# Patient Record
Sex: Female | Born: 1975 | Hispanic: No | Marital: Married | Smoking: Former smoker
Health system: Southern US, Community
[De-identification: ages and names within clinical notes are randomized; demographics above are authoritative.]

## PROBLEM LIST (undated history)

## (undated) ENCOUNTER — Emergency Department (HOSPITAL_COMMUNITY): Admission: EM | Disposition: A | Discharge status: Home / Self Care | Payer: Self-pay

## (undated) HISTORY — PX: BACK SURGERY: SHX140

## (undated) HISTORY — PX: CHOLECYSTECTOMY: SHX55

## (undated) HISTORY — PX: ABDOMINAL HYSTERECTOMY: SHX81

## (undated) HISTORY — PX: TUBAL LIGATION: SHX77

---

## 2000-12-12 ENCOUNTER — Emergency Department (HOSPITAL_COMMUNITY): Admission: EM | Admit: 2000-12-12 | Discharge: 2000-12-13 | Payer: Self-pay | Admitting: *Deleted

## 2000-12-13 ENCOUNTER — Encounter: Payer: Self-pay | Admitting: Emergency Medicine

## 2001-03-22 ENCOUNTER — Encounter: Admission: RE | Admit: 2001-03-22 | Discharge: 2001-03-22 | Payer: Self-pay | Admitting: Family Medicine

## 2001-03-22 ENCOUNTER — Encounter: Payer: Self-pay | Admitting: Family Medicine

## 2001-11-01 ENCOUNTER — Other Ambulatory Visit: Admission: RE | Admit: 2001-11-01 | Discharge: 2001-11-01 | Payer: Self-pay | Admitting: Family Medicine

## 2001-12-23 ENCOUNTER — Encounter: Payer: Self-pay | Admitting: Family Medicine

## 2001-12-23 ENCOUNTER — Encounter: Admission: RE | Admit: 2001-12-23 | Discharge: 2001-12-23 | Payer: Self-pay | Admitting: Family Medicine

## 2002-08-18 ENCOUNTER — Encounter: Admission: RE | Admit: 2002-08-18 | Discharge: 2002-08-18 | Payer: Self-pay | Admitting: *Deleted

## 2002-09-18 ENCOUNTER — Ambulatory Visit (HOSPITAL_COMMUNITY): Admission: RE | Admit: 2002-09-18 | Discharge: 2002-09-18 | Payer: Self-pay | Admitting: *Deleted

## 2002-09-29 ENCOUNTER — Encounter: Admission: RE | Admit: 2002-09-29 | Discharge: 2002-09-29 | Payer: Self-pay | Admitting: *Deleted

## 2003-01-29 ENCOUNTER — Encounter: Payer: Self-pay | Admitting: Family Medicine

## 2003-01-29 ENCOUNTER — Ambulatory Visit (HOSPITAL_COMMUNITY): Admission: RE | Admit: 2003-01-29 | Discharge: 2003-01-29 | Payer: Self-pay | Admitting: Family Medicine

## 2006-06-30 ENCOUNTER — Other Ambulatory Visit: Admission: RE | Admit: 2006-06-30 | Discharge: 2006-06-30 | Payer: Self-pay | Admitting: Family Medicine

## 2009-02-22 ENCOUNTER — Encounter: Admission: RE | Admit: 2009-02-22 | Discharge: 2009-04-18 | Payer: Self-pay | Admitting: Specialist

## 2010-08-01 ENCOUNTER — Inpatient Hospital Stay (HOSPITAL_COMMUNITY)
Admission: EM | Admit: 2010-08-01 | Discharge: 2010-08-03 | Payer: Self-pay | Source: Home / Self Care | Attending: Internal Medicine | Admitting: Internal Medicine

## 2010-08-04 LAB — COMPREHENSIVE METABOLIC PANEL
ALT: 16 U/L (ref 0–35)
AST: 28 U/L (ref 0–37)
Albumin: 3.8 g/dL (ref 3.5–5.2)
Alkaline Phosphatase: 56 U/L (ref 39–117)
BUN: 9 mg/dL (ref 6–23)
CO2: 25 mEq/L (ref 19–32)
Calcium: 9 mg/dL (ref 8.4–10.5)
Chloride: 106 mEq/L (ref 96–112)
Creatinine, Ser: 0.9 mg/dL (ref 0.4–1.2)
GFR calc Af Amer: >60 mL/min (ref >60)
GFR calc non Af Amer: >60 mL/min (ref >60)
Glucose, Bld: 81 mg/dL (ref 70–99)
Potassium: 3.8 mEq/L (ref 3.5–5.1)
Sodium: 138 mEq/L (ref 135–145)
Total Bilirubin: 0.4 mg/dL (ref 0.3–1.2)
Total Protein: 7.2 g/dL (ref 6.0–8.3)

## 2010-08-04 LAB — DIFFERENTIAL
Basophils Absolute: 0 10*3/uL (ref 0.0–0.1)
Basophils Absolute: 0 10*3/uL (ref 0.0–0.1)
Basophils Relative: 0 % (ref 0–1)
Basophils Relative: 0 % (ref 0–1)
Eosinophils Absolute: 3.7 10*3/uL — ABNORMAL HIGH (ref 0.0–0.7)
Eosinophils Absolute: 3.1 10*3/uL — ABNORMAL HIGH (ref 0.0–0.7)
Eosinophils Relative: 27 % — ABNORMAL HIGH (ref 0–5)
Eosinophils Relative: 28 % — ABNORMAL HIGH (ref 0–5)
Lymphocytes Relative: 12 % (ref 12–46)
Lymphocytes Relative: 10 % — ABNORMAL LOW (ref 12–46)
Lymphs Abs: 1.7 10*3/uL (ref 0.7–4.0)
Lymphs Abs: 1.1 10*3/uL (ref 0.7–4.0)
Monocytes Absolute: 1 10*3/uL (ref 0.1–1.0)
Monocytes Absolute: 0.7 10*3/uL (ref 0.1–1.0)
Monocytes Relative: 7 % (ref 3–12)
Monocytes Relative: 6 % (ref 3–12)
Neutro Abs: 7.4 10*3/uL (ref 1.7–7.7)
Neutro Abs: 6 10*3/uL (ref 1.7–7.7)
Neutrophils Relative %: 54 % (ref 43–77)
Neutrophils Relative %: 56 % (ref 43–77)

## 2010-08-04 LAB — TSH: TSH: 1.819 u[IU]/mL (ref 0.350–4.500)

## 2010-08-04 LAB — LIPASE, BLOOD
Lipase: 578 U/L — ABNORMAL HIGH (ref 11–59)
Lipase: 31 U/L (ref 11–59)

## 2010-08-04 LAB — POCT CARDIAC MARKERS
CKMB, poc: <1 ng/mL — ABNORMAL LOW (ref 1.0–8.0)
CKMB, poc: <1 ng/mL — ABNORMAL LOW (ref 1.0–8.0)
Myoglobin, poc: 80.2 ng/mL (ref 12–200)
Myoglobin, poc: 57.6 ng/mL (ref 12–200)
Troponin i, poc: <0.05 ng/mL (ref 0.00–0.09)
Troponin i, poc: <0.05 ng/mL (ref 0.00–0.09)

## 2010-08-04 LAB — GLUCOSE, CAPILLARY: Glucose-Capillary: 95 mg/dL (ref 70–99)

## 2010-08-04 LAB — LIPID PANEL
Cholesterol: 115 mg/dL (ref 0–200)
HDL: 37 mg/dL — ABNORMAL LOW (ref >39)
LDL Cholesterol: 73 mg/dL (ref 0–99)
Total CHOL/HDL Ratio: 3.1 RATIO
Triglycerides: 26 mg/dL (ref <150)
VLDL: 5 mg/dL (ref 0–40)

## 2010-08-04 LAB — URINALYSIS, ROUTINE W REFLEX MICROSCOPIC
Bilirubin Urine: NEGATIVE
Hgb urine dipstick: NEGATIVE
Ketones, ur: NEGATIVE mg/dL
Nitrite: NEGATIVE
Protein, ur: NEGATIVE mg/dL
Specific Gravity, Urine: 1.011 (ref 1.005–1.030)
Urine Glucose, Fasting: NEGATIVE mg/dL
Urobilinogen, UA: 1 mg/dL (ref 0.0–1.0)
pH: 7.5 (ref 5.0–8.0)

## 2010-08-04 LAB — CBC
HCT: 36.2 % (ref 36.0–46.0)
HCT: 33.6 % — ABNORMAL LOW (ref 36.0–46.0)
Hemoglobin: 11.7 g/dL — ABNORMAL LOW (ref 12.0–15.0)
Hemoglobin: 10.7 g/dL — ABNORMAL LOW (ref 12.0–15.0)
MCH: 27.7 pg (ref 26.0–34.0)
MCH: 27.8 pg (ref 26.0–34.0)
MCHC: 32.3 g/dL (ref 30.0–36.0)
MCHC: 31.8 g/dL (ref 30.0–36.0)
MCV: 85.8 fL (ref 78.0–100.0)
MCV: 87.3 fL (ref 78.0–100.0)
Platelets: 316 10*3/uL (ref 150–400)
Platelets: 273 10*3/uL (ref 150–400)
RBC: 4.22 MIL/uL (ref 3.87–5.11)
RBC: 3.85 MIL/uL — ABNORMAL LOW (ref 3.87–5.11)
RDW: 12.7 % (ref 11.5–15.5)
RDW: 12.7 % (ref 11.5–15.5)
WBC: 13.8 10*3/uL — ABNORMAL HIGH (ref 4.0–10.5)
WBC: 10.9 10*3/uL — ABNORMAL HIGH (ref 4.0–10.5)

## 2010-08-04 LAB — BASIC METABOLIC PANEL
BUN: 8 mg/dL (ref 6–23)
CO2: 23 mEq/L (ref 19–32)
Calcium: 8 mg/dL — ABNORMAL LOW (ref 8.4–10.5)
Chloride: 111 mEq/L (ref 96–112)
Creatinine, Ser: 0.83 mg/dL (ref 0.4–1.2)
GFR calc Af Amer: >60 mL/min (ref >60)
GFR calc non Af Amer: >60 mL/min (ref >60)
Glucose, Bld: 68 mg/dL — ABNORMAL LOW (ref 70–99)
Potassium: 3.9 mEq/L (ref 3.5–5.1)
Sodium: 139 mEq/L (ref 135–145)

## 2010-08-15 NOTE — Discharge Summary (Signed)
NAMEROCKELLE, HEUERMAN              ACCOUNT NO.:  [id number]  MEDICAL RECORD NO.:  [id number]          Hendricks TYPE:  INP  LOCATION:  5502                         FACILITY:  [facility]  PHYSICIAN:  Tilford Pillar, MD     DATE OF BIRTH:  1976-01-16  DATE OF ADMISSION:  08/01/2010 DATE OF DISCHARGE:  08/03/2010                              DISCHARGE SUMMARY   DISCHARGE DIAGNOSES: 1. Acute pancreatitis. 2. Anemia. 3. Headache.  DISCHARGE MEDICATIONS:  Cymbalta and Aleve.  DISPOSITION AND FOLLOWUP:  Jasmine Hendricks was discharged from [facility] in stable and improved condition.  Jasmine Hendricks will need to follow up with Jasmine Hendricks primary care physician, Dr. Denny Peon in 1-2 weeks for hospital followup.  CONSULTATIONS:  None performed.  PROCEDURES PERFORMED: 1. Chest x-ray with 2 views.  This indicated no active cardiopulmonary     disease. 2. Abdominal ultrasound.  This indicated a surgically absent     gallbladder.  Distal common bile duct obscured by duodenal gas     bubble.  Gallbladder surgically absent and no other significant     abnormalities found.  ADMISSION HISTORY OF PRESENT ILLNESS:  Jasmine Hendricks is a 35 year old female who presents with Jasmine chief complaint of persistent abdominal pain for Jasmine last 6 hours.  Jasmine Hendricks describes Jasmine pain as burning in quality and radiating to Jasmine left shoulder and also through to Jasmine back, some diaphoresis present with Jasmine pain.  Jasmine Hendricks denies any cough, dyspnea, fever, or palpitations.  Over Jasmine past 8 months, Jasmine Hendricks describes having alternating constipation and diarrhea.  Jasmine Hendricks denies blood in Jasmine stools, black tarry stools, pale stools, or particularly foul-smelling stools.  Additionally, Jasmine Hendricks complains of some nausea and vomiting over Jasmine same period of time associated with eating.  Jasmine Hendricks denies bloody or bilious vomit.  Finally, Jasmine Hendricks describes a long history of hypoglycemic episodes with glucose readings in Jasmine 30-40 range, active smoker with 0.5  to 1.0 packs per day.  Alcohol intake includes 1-2 drinks per week at most, denied illicit drug use, uses daily Aleve x2, and occasionally use additional ibuprofen.  Denies having experienced any painful episodes like this in Jasmine past.  ADMISSION PHYSICAL EXAMINATION:  VITAL SIGNS:  Temperature 98.7 degrees Fahrenheit, pulse 75 beats per minute, blood pressure 125/84, respiratory rate 16, O2 saturation 100% on room air. GENERAL:  Jasmine Hendricks is alert and oriented x3, euphoric on Dilaudid. EYES:  PERRL, EOMI. EARS, NOSE, THROAT:  Mucous membranes moist. NECK:  Supple. RESPIRATORY:  Lungs clear to auscultation bilaterally.  Normal work of breathing. CARDIOVASCULAR:  Regular rate and rhythm.  No murmurs, rubs, or gallops. Distal pulses 2 positive. ABDOMEN:  Soft, nontender, nondistended.  No rebound tenderness.  Bowel sounds positive.  No organomegaly. EXTREMITIES:  No pedal edema, cyanosis, or clubbing. NEURO:  Motor function and sensation to fine touch grossly intact. Cranial nerves II through XII grossly intact. PSYCH:  Goal directed, euphoric on Dilaudid.  ADMISSION LABORATORY DATA:  White blood cells 13.8, red blood cells 4.22, hemoglobin 11.7, hematocrit 36.2, MCV 85.8, MCH 27.7, MCHC 32.3, RDW 12.7, platelet count 316, neutrophils percentage 54, lymphocytes percentage 12, monocytes  percentage 7, eosinophils percentage 27, basophils percentage 0.  Neutrophils absolute 7.4, lymphocytes absolute 1.7, monocytes absolute 1.0, eosinophils absolute 3.7, basophils absolute 0.0.  Smear review morphology unremarkable.  Sodium 138, potassium 3.8, chloride 106, CO2 of 25, glucose 81, BUN 9, creatinine 0.90.  GFR greater than 60.  Bilirubin total 0.4, alkaline phosphatase 56, AST 28, ALT 16, total protein 7.2, albumin blood 3.8, calcium 9.0. Lipase 578.  Urinalysis, urine color yellow, appearance clear.  Specific gravity 1.011, pH 7.5, urine glucose negative, bilirubin negative, ketones  negative, blood negative, protein negative, urobilinogen 1.0, nitrites negative, leukocytes negative.  CK-MB point-of-care less than 1.0.  Troponin I point-of-care less than 0.05.  Myoglobin point-of-care 57.6.  Cholesterol 115, triglycerides - LIPR 26, cholesterol, HDL 37, cholesterol, LDL 73, cholesterol, VLDL 5, total cholesterol to HDL ratio 3.1.  Thyroid stimulating hormone 1.819.  HOSPITAL COURSE BY PROBLEM LIST: 1. Pancreatitis.  Jasmine Hendricks presented on August 01, 2010, with Jasmine     approximately 6 hours of persistent epigastric abdominal pain     radiate through to Jasmine back and to Jasmine left shoulder.  Admission     labs indicated leukocytosis with Jasmine white blood cell count of 13.3     and also elevated lipase of 578.  Abdominal ultrasound indicated no     stones were present.  I started Jasmine Hendricks on IV Dilaudid and     normal saline and Jasmine Hendricks was placed on n.p.o. status.  Jasmine     Hendricks received Jasmine Hendricks last dose of Dilaudid in approximately 22:30     p.m. on Jasmine first night of admission.  In Jasmine morning, Jasmine Hendricks     described no abdominal pain while resting or to palpation, but Jasmine Hendricks     did complain of diffuse frontal headache.  Labs in Jasmine morning     showed Jasmine lipase at decreased to 31.  Diet was advanced to clear     liquids and advanced as tolerated with meals.  Jasmine Hendricks was given     Tylenol p.r.n. for pain.  Through Jasmine course of Jasmine day, Jasmine     Hendricks continued to advance Jasmine Hendricks diet without complaint and     remained stable and without any complaints until Jasmine Hendricks discharge on     August 03, 2010.  Likely cause of Jasmine Hendricks complaints is unknown but     could be secondary to a small common bile duct stone that passed      spontaneously. 2. Anemia.  Admission labs indicated a normocytic anemia with Jasmine     hemoglobin of 11.7 and an MCV of 85.7.  Considering Jasmine Hendricks history of     possible IBS and acute pancreatitis, this is likely an anemia with     chronic inflammation.  Jasmine Hendricks vitals and  hemoglobin remain stable due     to course of Jasmine Hendricks hospitalization. 3. Headache.  Jasmine Hendricks complained of headache on Jasmine morning after     Jasmine Hendricks first night of admission and likely from having been n.p.o. for     nearly 24 hours.  Jasmine Hendricks was given Tylenol p.r.n. for pain and     Jasmine Hendricks diet was advanced.  Headache resolved with Tylenol.  DISCHARGE VITAL SIGNS:  Temperature 98.0, pulse 94, blood pressure 121/80, respiratory rate 17, O2 saturation 98% on room air.  DISCHARGE LABORATORY DATA:  White blood cells 10.9, red blood cells 3.85, hemoglobin 10.7, hematocrit 33.6, MCV 87.3, MCH 27.8, MCHC 31.8, RDW 12.7, platelet count  273, neutrophils percentage 56, lymphocytes percentage 10, monocytes percentage 6, eosinophils percentage 28, basophils percentage 0.  Neutrophils absolute 6.0, lymphocytes absolute 1.1, monocytes absolute 0.7, eosinophils absolute 3.1, basophils absolute 0.0.  Red blood cell morphology polychromasia present.  Sodium 139, potassium 3.9, chloride 111, CO2 of 23, glucose 68, BUN 8, creatinine 0.83.  GFR greater than 60.  Calcium 8.0.  Lipase 31.    ______________________________ Leodis Sias, MD   ______________________________ Tilford Pillar, MD    CP/MEDQ  D:  08/05/2010  T:  08/06/2010  Job:  [id number]  Electronically Signed by Leodis Sias MD on 08/07/2010 01:47:01 PM Electronically Signed by Blanch Media M.D. on 08/13/2010 10:05:19 AM

## 2010-09-16 ENCOUNTER — Other Ambulatory Visit (INDEPENDENT_AMBULATORY_CARE_PROVIDER_SITE_OTHER): Payer: Self-pay | Admitting: Internal Medicine

## 2010-09-16 ENCOUNTER — Ambulatory Visit (INDEPENDENT_AMBULATORY_CARE_PROVIDER_SITE_OTHER): Payer: PRIVATE HEALTH INSURANCE | Admitting: Internal Medicine

## 2010-09-16 DIAGNOSIS — K859 Acute pancreatitis without necrosis or infection, unspecified: Secondary | ICD-10-CM

## 2010-09-16 DIAGNOSIS — R1013 Epigastric pain: Secondary | ICD-10-CM

## 2010-09-18 ENCOUNTER — Ambulatory Visit (HOSPITAL_COMMUNITY)
Admission: RE | Admit: 2010-09-18 | Discharge: 2010-09-18 | Disposition: A | Discharge status: Home / Self Care | Payer: PRIVATE HEALTH INSURANCE | Source: Ambulatory Visit | Attending: Internal Medicine | Admitting: Internal Medicine

## 2010-09-18 ENCOUNTER — Encounter (HOSPITAL_COMMUNITY): Payer: Self-pay

## 2010-09-18 DIAGNOSIS — K859 Acute pancreatitis without necrosis or infection, unspecified: Secondary | ICD-10-CM

## 2010-09-18 DIAGNOSIS — R1013 Epigastric pain: Secondary | ICD-10-CM

## 2010-09-18 DIAGNOSIS — K429 Umbilical hernia without obstruction or gangrene: Secondary | ICD-10-CM | POA: Insufficient documentation

## 2010-09-18 MED ORDER — IOHEXOL 300 MG/ML  SOLN
100.0000 mL | Freq: Once | INTRAMUSCULAR | Status: AC | PRN
  Administered 2010-09-18: 100 mL via INTRAVENOUS

## 2010-09-21 NOTE — Consult Note (Addendum)
Jasmine Hendricks              ACCOUNT NO.:  [id number]  MEDICAL RECORD NO.:  [id number]           PATIENT TYPE:  LOCATION:   Office                             FACILITY:  PHYSICIAN:  Jasmine Hendricks, M.D.    DATE OF BIRTH:  01/27/1976  DATE OF CONSULTATION: 09/16/2010 DATE OF DISCHARGE:                                CONSULTATION   Ms.  Jasmine Hendricks is a referral from Dr. Dimas Hendricks for pancreatitis. Jasmine Hendricks was apparently admitted to [facility] in August 01, 2010, with a diagnosis of pancreatitis.  She spent two nights in the hospital.  Her symptoms included nausea, vomiting and epigastric pain.  She has had the nausea and vomiting for approximately several years.  She denies excessive EtOH.  She states today she does have constant epigastric pain.  It was noted on admission when she was at the [facility], there her amylase was elevated at 578.  On August 06, 2010, her amylase had came down to 37 and this apparently was on an outpatient basis ordered by Dr. Garner Hendricks.  She did undergo ultrasound of the abdomen on August 01, 2010, which revealed a normal abdominal ultrasound, postcholecystectomy.  Her common bile duct measured approximately 7 mm in diameter.  The distal duct obscured by duodenal bowel gas.  No visible bile duct stones.  The pancreas was normal.  She usually has a bowel movement every other day and has been no weight loss.  Labs during her admission; her WBC count was 13.8, hemoglobin was 11.7, hematocrit was 36.2, her MCV was 85.8, her platelet count was 316. Sodium 138, potassium 3.8, chloride 106, glucose 81, BUN 9, her total bilirubin was 0.4, ALP 96, AST 26, total protein 7, albumin 3.6.  Her urine negative.  On August 07, 2010, her hemoglobin was 10.8, hematocrit was 33.6.  Her serum iron was 13, saturation 4, ferritin 8, vitamin B12 130, folate 11.6.  Her iron binding capacity was 367 and a UIBC was 354.  She was started on iron 325  daily.  HOME MEDICATIONS: 1. Cymbalta 60 mg daily. 2. Advil 800 mg as needed, but not often. 3. Iron 325 two a day. 4. Zofran p.r.n.  She is allergic to Morphine.  She has had a partial hysterectomy.  She underwent a cholecystectomy in 2009 for gallstones, history of hypertension.  FAMILY HISTORY:  Her mother is deceased from an aneurysm.  Her father is alive, health unknown.  One sister in good health.  Two brothers in good health.  SOCIAL HISTORY:  She is married.  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  She smokes one pack every 2 days.  She rarely drinks alcohol and she has three children in good health.  OBJECTIVE:  VITAL SIGNS:  Her weight is 183, her height is 5 feet and 4 inches, her temperature is 98, her blood pressure is 102/72 and her pulse is 76. HEENT:  She has natural teeth.  Her oral mucosa is moist.  Her conjunctivae is pink.  Her sclerae are anicteric.  Her thyroid is normal. NECK:  There is no cervical lymphadenopathy. LUNGS:  Clear. ABDOMEN:  Soft.  Bowel sounds are positive.  No masses.  She does have epigastric tenderness.  I guaiaced her stool given her history of anemia and she was guaiac negative today.  ASSESSMENT:  Ms. Jasmine Hendricks is a 35 year old female with complaints of epigastric pain, nausea and vomiting.  Her nausea and vomiting has been occurring on and off for couple of years now.  She does continue to have epigastric pain.  RECOMMENDATIONS:  I did discuss this case with Jasmine Hendricks.  We will get a CT of the abdomen and pelvis to look at her pancreas again.  If her CT is normal, he will proceed with an EGD.  If her EGD is normal, he will proceed with an EUS and further recommendations once we have these tests.    ______________________________ Jasmine Ar, NP   ______________________________ Jasmine Hendricks, M.D.    TS/MEDQ  D:  09/16/2010  T:  09/17/2010  Job:  [id number]  cc:   Dr. Dimas Hendricks  Electronically Signed by Jasmine Ar PA on 10/10/2010 11:46:26 AM Electronically Signed by Jasmine Hendricks M.D. on 10/21/2010 12:55:41 PM

## 2010-10-08 ENCOUNTER — Encounter (HOSPITAL_BASED_OUTPATIENT_CLINIC_OR_DEPARTMENT_OTHER): Payer: PRIVATE HEALTH INSURANCE | Admitting: Internal Medicine

## 2010-10-08 ENCOUNTER — Ambulatory Visit (HOSPITAL_COMMUNITY)
Admission: RE | Admit: 2010-10-08 | Discharge: 2010-10-08 | Disposition: A | Discharge status: Home / Self Care | Payer: PRIVATE HEALTH INSURANCE | Source: Ambulatory Visit | Attending: Internal Medicine | Admitting: Internal Medicine

## 2010-10-08 DIAGNOSIS — R112 Nausea with vomiting, unspecified: Secondary | ICD-10-CM | POA: Insufficient documentation

## 2010-10-08 DIAGNOSIS — R1013 Epigastric pain: Secondary | ICD-10-CM | POA: Insufficient documentation

## 2010-10-08 DIAGNOSIS — K2960 Other gastritis without bleeding: Secondary | ICD-10-CM

## 2010-10-09 LAB — H. PYLORI ANTIBODY, IGG: H Pylori IgG: 0.71 {ISR}

## 2010-10-21 NOTE — Op Note (Signed)
  NAMEJAILINE, Jasmine Hendricks              ACCOUNT NO.:  [id number]  MEDICAL RECORD NO.:  [id number]           PATIENT TYPE:  O  LOCATION:  DAYP                          FACILITY:  [facility]  PHYSICIAN:  Lionel December, M.D.    DATE OF BIRTH:  05-10-1976  DATE OF PROCEDURE:  10/08/2010 DATE OF DISCHARGE:                              OPERATIVE REPORT   PROCEDURE:  Esophagogastroduodenoscopy.  INDICATION:  Jasmine Hendricks is a 35 year old female with recurrent epigastric pain.  Back in January, she was diagnosed with pancreatitis and was briefly hospitalized at [facility].  However, no cause was established.  The patient does not drink alcohol.  During her workup, she was also found to have iron deficiency, but her hemoglobin is normal at 11.7.  However, there is no history of melena or rectal bleeding.  Her stool Hemoccult in the office was negative.  She is using Advil fairly frequently. Therefore, she could have peptic ulcer disease.  She is status post cholecystectomy for symptomatic cholelithiasis over 2 years ago.  She is undergoing diagnostic EGD looking for peptic ulcer disease that might explain her symptoms.  Procedure risks were reviewed with the patient.  Informed consent was obtained.  MEDS FOR CONSCIOUS SEDATION:  Cetacaine spray for pharyngeal topical anesthesia, Demerol 50 mg IV, Versed 6 mg IV.  FINDINGS:  Procedure performed in endoscopy suite.  The patient's vital signs and O2 sats were monitored during the procedure and remained stable.  The patient was placed in left lateral recumbent position and Pentax videoscope was passed to the oropharynx without any difficulty into the esophagus.  ESOPHAGUS:  Mucosa of the esophagus normal.  GE junction was located at 37 cm from the incisors and was unremarkable.  Stomach was empty and distended very well insufflation.  Folds proximal stomach are normal.  Examination of mucosa at body and antrum was normal.  There was prepyloric erythema, but  no erosions or ulcers noted. Pyloric channel was patent.  Angularis, fundus, and cardia were examined by retroflexing the scope were normal.  Duodenum, bulbar, and postbulbar mucosa was normal.  Endoscope was withdrawn.  The patient tolerated the procedure well.  FINAL DIAGNOSIS:  Prepyloric erythema, otherwise normal esophagogastroduodenoscopy.  I doubt that this finding would explain the patient's symptoms.  RECOMMENDATIONS:  The patient advised to take least amount of Advil that she can get by with.  Dicyclomine 10 mg before each meal.  We will check her H. pylori serology today.  Further recommendations to follow.     Lionel December, M.D.     NR/MEDQ  D:  10/08/2010  T:  10/08/2010  Job:  [id number]  cc:   Donzetta Sprung Fax: [phone]  Electronically Signed by Lionel December M.D. on 10/21/2010 12:56:16 PM

## 2010-12-05 NOTE — Op Note (Signed)
NAMEARGENTINA, Jasmine Hendricks                        ACCOUNT NO.:  [id number]   MEDICAL RECORD NO.:  [id number]                   PATIENT TYPE:  AMB   LOCATION:  SDC                                  FACILITY:  [facility]   PHYSICIAN:  Mary Sella. Orlene Erm, M.D.                 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   PROCEDURE:  Laparoscopic bilateral tubal ligation with cautery.   SURGEON:  Mary Sella. Orlene Erm, M.D.   ANESTHESIA:  General endotracheal.   COMPLICATIONS:  None.   SPECIMENS:  None.   ESTIMATED BLOOD LOSS:  Minimal.   FINDINGS:  Normal uterus, tubes, and ovaries bilaterally.  Normal appendix  and liver edge and gallbladder seen.  Normal posterior cul-de-sac without  evidence of endometriosis.   DISPOSITION:  To recovery room stable.   INDICATION FOR PROCEDURE:  The patient is a 35 year old gravida 3, para 3,  white female who presented to the [facility] clinic with undesired  fertility.  The patient had been considering sterilization for over a year  and was consented and 30-day papers.  The patient was counseled on the risks  of bleeding, infection, injury to internal organs, risk of transfusion.  The  patient was also counseled on the risk of failure of 1 per 200 and an  increased risk of ectopic gestation.  Finally the patient was counseled on  alternative forms of contraception.  These risks were reviewed with the  patient on the day of surgery with her boyfriend present.  She elected to  proceed with sterilization.   DESCRIPTION OF PROCEDURE:  The patient was taken to the operating room, and  she was placed under general endotracheal anesthesia.  She was prepped and  draped in a sterile fashion.  A  speculum was placed in the vagina and the  cervix was grasped with a Hulka tenaculum.  The attention was then turned to  the umbilicus, where the umbilicus was injected with 0.5% Marcaine.  An  incision was made at the umbilicus with a scalpel and a Veress needle was  introduced.  The abdomen was insufflated to approximately 4 L of CO2 gas.  A  10/12 trocar was then introduced to the umbilicus.  The pelvic anatomy and  abdominal anatomy was visualized and found to be normal as previously  described.  The patient's fallopian tubes were identified by the fimbriated  ends.  An area approximately 2-3 cm from the cornual region was grasped with  the Kleppinger and cautery was applied in three existing segments of tube.  This was performed bilaterally on both tubes.  The CO2 gas was evacuated and  the trocar was removed from the abdomen.  A subcutaneous stitch of 3-0  Vicryl was used to close the incision.  Dermabond was placed over  the incision as well.  The instruments were removed from the vagina, and  there was some oozing from the cervix.  Pressure was applied with a ring  forceps, which obtained hemostasis.  The patient was then awakened and taken  to the recovery room in stable condition.                                               Mary Sella. Orlene Erm, M.D.    EMH/MEDQ  D:  09/18/2002  T:  09/18/2002  Job:  [id number]

## 2010-12-05 NOTE — H&P (Signed)
NAME:  Jasmine Hendricks, Jasmine Hendricks NO.:  [id number]   MEDICAL RECORD NO.:  [id number]                   PATIENT TYPE:   LOCATION:                                       FACILITY:   PHYSICIAN:  Mary Sella. Orlene Erm, M.D.                 DATE OF BIRTH:   DATE OF ADMISSION:  DATE OF DISCHARGE:                                HISTORY & PHYSICAL   SUBJECTIVE:  The patient is a 35 year old gravida 3 para 3 white female who  presents as a referral from [facility] for  counseling on bilateral tubal ligation.  The patient states that she has  been considering tubal ligation for approximately six months now.  She is  not interested in having any more children.  She is currently on oral  contraceptive pills but wants to get off these.  The patient is counseled on  the risks of bilateral tubal ligation including the risk of bleeding,  infection, injury to internal organs.  In addition, she is counseled that  this is a permanent procedure and she should plan to no longer have any more  children.  She finally was counseled on alternatives forms of contraception.  She was counseled that there is a failure rate of 1/200 and increased risk  of ectopic gestation.   PAST MEDICAL HISTORY:  Significant for kidney stones and history of  depression.   PAST SURGICAL HISTORY:  Laser conization of her cervix and wisdom teeth  extraction.   PAST GYNECOLOGICAL HISTORY:  Significant for dysplasia.   PAST OBSTETRICAL HISTORY:  She has undergone vaginal deliveries x3.   MEDICATIONS:  Currently are oral contraceptive pills.   SOCIAL HISTORY:  Positive for alcohol use, negative for illicit drug use or  smoking.   FAMILY HISTORY:  Significant for strokes, alcoholism, heart disease, high  blood pressure, and diabetes.   PHYSICAL EXAMINATION:  VITAL SIGNS:  Blood pressure 134/80, weight 160.8,  pulse 80.  HEENT:  Normocephalic, atraumatic.  HEART:  Regular rate and  rhythm without murmurs, rubs, or gallops.  LUNGS:  Clear to auscultation bilaterally.  ABDOMEN:  Soft, nondistended.  EXTREMITIES:  Without clubbing, cyanosis, or edema.  PELVIC:  EG/BUS is normal.  Vagina is pink and rugated without lesions.  Uterus is 8 cm and anteverted.  There are no adnexal masses noted.   ASSESSMENT AND PLAN:  A 35 year old gravida 3 para 3 with undesired  fertility.  The patient is counseled on the risks and benefits of bilateral  tubal ligation as above.  The patient signed tubal papers today and wishes  to schedule tubal ligation in approximately 30 days.  Will schedule the  patient for surgery at that time.  Mary Sella. Orlene Erm, M.D.   EMH/MEDQ  D:  08/18/2002  T:  08/18/2002  Job:  [id number]

## 2011-02-02 ENCOUNTER — Other Ambulatory Visit: Payer: Self-pay | Admitting: Specialist

## 2011-02-02 DIAGNOSIS — M549 Dorsalgia, unspecified: Secondary | ICD-10-CM

## 2011-02-04 ENCOUNTER — Ambulatory Visit
Admission: RE | Admit: 2011-02-04 | Discharge: 2011-02-04 | Disposition: A | Discharge status: Home / Self Care | Payer: Worker's Compensation | Source: Ambulatory Visit | Attending: Specialist | Admitting: Specialist

## 2011-02-04 VITALS — BP 140/71 | HR 73

## 2011-02-04 DIAGNOSIS — M549 Dorsalgia, unspecified: Secondary | ICD-10-CM

## 2011-02-04 MED ORDER — ONDANSETRON HCL 4 MG/2ML IJ SOLN
4.0000 mg | Freq: Once | INTRAMUSCULAR | Status: AC
  Administered 2011-02-04: 4 mg via INTRAMUSCULAR

## 2011-02-04 MED ORDER — MEPERIDINE HCL 100 MG/ML IJ SOLN
75.0000 mg | Freq: Once | INTRAMUSCULAR | Status: AC
  Administered 2011-02-04: 75 mg via INTRAMUSCULAR

## 2011-02-04 MED ORDER — DEXTROSE-NACL 5-0.45 % IV SOLN: INTRAVENOUS | Status: DC

## 2011-02-04 MED ORDER — DIAZEPAM 2 MG PO TABS
10.0000 mg | ORAL_TABLET | Freq: Once | ORAL | Status: AC
  Administered 2011-02-04: 10 mg via ORAL

## 2011-02-04 MED ORDER — IOHEXOL 180 MG/ML  SOLN
15.0000 mL | Freq: Once | INTRAMUSCULAR | Status: AC | PRN
  Administered 2011-02-04: 15 mL via INTRATHECAL

## 2011-02-04 NOTE — Progress Notes (Signed)
Pt up to restroom, gait steady. Pt ready to be discharged.dd

## 2011-03-20 ENCOUNTER — Encounter (HOSPITAL_COMMUNITY): Payer: Worker's Compensation

## 2011-03-20 ENCOUNTER — Other Ambulatory Visit: Payer: Self-pay | Admitting: Specialist

## 2011-03-20 ENCOUNTER — Ambulatory Visit (HOSPITAL_COMMUNITY)
Admission: RE | Admit: 2011-03-20 | Discharge: 2011-03-20 | Disposition: A | Discharge status: Home / Self Care | Payer: Worker's Compensation | Source: Ambulatory Visit | Attending: Specialist | Admitting: Specialist

## 2011-03-20 ENCOUNTER — Other Ambulatory Visit (HOSPITAL_COMMUNITY): Payer: Self-pay | Admitting: Specialist

## 2011-03-20 DIAGNOSIS — M5137 Other intervertebral disc degeneration, lumbosacral region: Secondary | ICD-10-CM | POA: Insufficient documentation

## 2011-03-20 DIAGNOSIS — Z01818 Encounter for other preprocedural examination: Secondary | ICD-10-CM

## 2011-03-20 DIAGNOSIS — M51379 Other intervertebral disc degeneration, lumbosacral region without mention of lumbar back pain or lower extremity pain: Secondary | ICD-10-CM | POA: Insufficient documentation

## 2011-03-20 DIAGNOSIS — Z01812 Encounter for preprocedural laboratory examination: Secondary | ICD-10-CM | POA: Insufficient documentation

## 2011-03-20 DIAGNOSIS — M47817 Spondylosis without myelopathy or radiculopathy, lumbosacral region: Secondary | ICD-10-CM | POA: Insufficient documentation

## 2011-03-20 DIAGNOSIS — Z01810 Encounter for preprocedural cardiovascular examination: Secondary | ICD-10-CM | POA: Insufficient documentation

## 2011-03-20 LAB — APTT: aPTT: 34 seconds (ref 24–37)

## 2011-03-20 LAB — COMPREHENSIVE METABOLIC PANEL
ALT: 20 U/L (ref 0–35)
BUN: 10 mg/dL (ref 6–23)
Calcium: 9.4 mg/dL (ref 8.4–10.5)
Creatinine, Ser: 0.82 mg/dL (ref 0.50–1.10)
GFR calc Af Amer: >60 mL/min (ref >60)
Glucose, Bld: 85 mg/dL (ref 70–99)
Sodium: 137 mEq/L (ref 135–145)
Total Protein: 8.1 g/dL (ref 6.0–8.3)

## 2011-03-20 LAB — URINALYSIS, ROUTINE W REFLEX MICROSCOPIC
Bilirubin Urine: NEGATIVE
Hgb urine dipstick: NEGATIVE
Ketones, ur: NEGATIVE mg/dL
Nitrite: NEGATIVE
pH: 6 (ref 5.0–8.0)

## 2011-03-20 LAB — PROTIME-INR
INR: 0.97 (ref 0.00–1.49)
Prothrombin Time: 13.1 seconds (ref 11.6–15.2)

## 2011-03-20 LAB — CBC
Hemoglobin: 12.2 g/dL (ref 12.0–15.0)
MCH: 27.3 pg (ref 26.0–34.0)
MCHC: 32.1 g/dL (ref 30.0–36.0)
MCV: 85 fL (ref 78.0–100.0)

## 2011-03-20 LAB — SURGICAL PCR SCREEN: MRSA, PCR: NEGATIVE

## 2011-04-01 ENCOUNTER — Ambulatory Visit (HOSPITAL_COMMUNITY): Payer: Worker's Compensation

## 2011-04-01 ENCOUNTER — Observation Stay (HOSPITAL_COMMUNITY)
Admission: RE | Admit: 2011-04-01 | Discharge: 2011-04-02 | Disposition: A | Discharge status: Home / Self Care | Payer: Worker's Compensation | Source: Ambulatory Visit | Attending: Specialist | Admitting: Specialist

## 2011-04-01 DIAGNOSIS — F17200 Nicotine dependence, unspecified, uncomplicated: Secondary | ICD-10-CM | POA: Insufficient documentation

## 2011-04-01 DIAGNOSIS — M48061 Spinal stenosis, lumbar region without neurogenic claudication: Secondary | ICD-10-CM | POA: Insufficient documentation

## 2011-04-01 DIAGNOSIS — F411 Generalized anxiety disorder: Secondary | ICD-10-CM | POA: Insufficient documentation

## 2011-04-01 DIAGNOSIS — M5126 Other intervertebral disc displacement, lumbar region: Principal | ICD-10-CM | POA: Insufficient documentation

## 2011-04-01 DIAGNOSIS — Z79899 Other long term (current) drug therapy: Secondary | ICD-10-CM | POA: Insufficient documentation

## 2011-04-02 NOTE — Op Note (Signed)
Jasmine Hendricks, Jasmine Hendricks NO.:  [id number]  MEDICAL RECORD NO.:  [id number]  LOCATION:  1539                         FACILITY:  [facility]  PHYSICIAN:  Jene Every, M.D.    DATE OF BIRTH:  May 15, 1976  DATE OF PROCEDURE:  04/01/2011 DATE OF DISCHARGE:                              OPERATIVE REPORT   PREOPERATIVE DIAGNOSES:  Spinal stenosis; herniated nucleus pulposus L5- S1, left.  POSTOPERATIVE DIAGNOSES:  Spinal stenosis; herniated nucleus pulposus L5- S1, left.  PROCEDURES PERFORMED:  Decompression L5-S1, left; foraminotomies of L5 and S1; and microdiskectomy of L5-S1.  ANESTHESIA:  General.  ASSISTANT:  Roma Schanz, P.A.  BRIEF HISTORY:  34, left lower extremity radicular pain, disk degeneration, had back pain.  She had neural foraminal stenosis at L5, a small disk extrusion into the foramen, radiating pain down and to the top of her foot, EHL weakness, temporary relief from a slight nerve root block at L5.  She was indicated for decompression of the L5 root.  We did discuss concomitant fusion at L5-S1, she had intermittent back pain, we agreed not to proceed with primary procedure with a lumbar fusion due to her predominantly L5 radicular pain, we felt a foraminotomy at L5 would address the L5 symptoms.  We did discuss risks and benefits, including bleeding, infection, damage to neurovascular structures, no change in symptoms, worsening symptoms, need for repeat decompression, DVT, PE, and anesthetic complications, etc.  TECHNIQUE:  With the patient in supine position after induction of adequate anesthesia, 2 g of Kefzol, lumbar region was prepped and draped in the usual sterile fashion.  An 18-gauge spinal needle was utilized to localize the L5-S1 interspace, confirmed with x-ray.  Incision was made from the spinous process of L5-S1.  Subcutaneous tissue was dissected. Electrocautery was utilized to achieve hemostasis.  Dorsolumbar  fascia identified, divided in line of the skin incision.  Paraspinous muscle elevated from lamina of L5 and S1.  Operating microscope draped and brought in the surgical field after a Penfield 4 was placed in the interlaminar space.  Utilized a straight curette, detached ligamentum flavum from cephalad edge of S1.  Neuro patty placed beneath the ligamentum flavum.  Foraminotomy of S1 was performed.  She had fairly stenotic lateral recess at L5-S1 and gently mobilized the S1 nerve medially after performing a foraminotomy and decompress the lateral recess to the medial border of pedicle.  Focal HNP was noted and therefore, I performed an annulotomy and removed the focal HNP with a micropituitary and mobilized with a nerve hook.  Next, from the opposite side of the operating room table, I performed a foraminotomy at L5, protecting neural elements at all times.  Following the decompression of L5 and S1, the hockey stick probe passed freely up the foramen of L5 and S1, we had at least 1 cm excursion of the S1 nerve root meeting the pedicle without difficulty.  Next, I obtained an x-ray with the Penfield 4 in the interlaminar space.  The scope was irrigated with antibiotic irrigation.  No CSF leakage or active bleeding.  I removed the McCullough retractor.  Paraspinous muscles were inspected.  No evidence of active bleeding.  Dorsolumbar fascia reapproximated with  0 Vicryl and interrupted figure-of-8 sutures, subcu with 2-0 Vicryl simple sutures, skin was reapproximated with 4-0 subcuticular Prolene.  Wound reinforced with Steri-Strips.  Sterile dressing applied, placed supine on hospital bed, extubated without difficulty and transported to the recovery room in satisfactory condition.  The patient tolerated the procedure well.  No complications.  Minimal blood loss.     Jene Every, M.D.     Cordelia Pen  D:  04/01/2011  T:  04/01/2011  Job:  [id number]  Electronically Signed by Jene Every M.D. on 04/02/2011 07:36:13 AM

## 2011-04-17 NOTE — Discharge Summary (Signed)
  NAMEJASMEEN, Jasmine Hendricks NO.:  [id number]  MEDICAL RECORD NO.:  [id number]  LOCATION:  1539                         FACILITY:  [facility]  PHYSICIAN:  Jene Every, M.D.    DATE OF BIRTH:  1975-09-11  DATE OF ADMISSION:  04/01/2011 DATE OF DISCHARGE:  04/02/2011                              DISCHARGE SUMMARY   ADMISSION DIAGNOSIS:  Spinal stenosis, herniated nucleus pulposus L5-S1 on the left.  DISCHARGE DIAGNOSIS:  Status post lumbar decompression, L5-S1 on the left.  HOSPITAL COURSE:  Uneventful.  DISPOSITION:  The patient postop day #1 was stable, to follow up with Dr. Shelle Iron in approximately 10 to 14 days.  DIET:  As tolerated.  Wound Care is to change her dressing daily.  ACTIVITY:  She is to walk as tolerated with back precautions.  MEDICATIONS:  As per med rec sheet.  CONDITION ON DISCHARGE:  Stable.  FINAL DIAGNOSIS:  Doing well, status post lumbar decompression L5-S1, microdiskectomy L5-S1 on the left.     Roma Schanz, P.A.   ______________________________ Jene Every, M.D.    CS/MEDQ  D:  04/10/2011  T:  04/10/2011  Job:  [id number]  Electronically Signed by Roma Schanz P.A. on 04/13/2011 12:14:34 PM Electronically Signed by Jene Every M.D. on 04/17/2011 01:52:42 PM

## 2012-09-15 IMAGING — CT CT ABD-PELV W/ CM
2 of 4 series · 16 of 46 positions shown, 18 images · IV contrast (Omnipaque 300)
Comparison: None

CLINICAL DATA: Situs, epigastric pain, nausea, vomiting, past
history of cholecystectomy, hysterectomy, pancreatitis

CT ABDOMEN AND PELVIS WITH CONTRAST
TECHNIQUE: Multidetector CT imaging of the abdomen and pelvis was
performed following the standard protocol during bolus
administration of intravenous contrast. Breast shield utilized.
Sagittal and coronal MPR images reconstructed from axial data set.
Contrast: Dilute oral contrast. 100 ml Omnipaque 300 IV

[Series 2: abd_pel_with 5.0 b40f · axial · 0.64mm/px · z∈[-425,-15]mm · 13 of 90 slices shown, 15 images]
[im 4/90  soft-tissue]
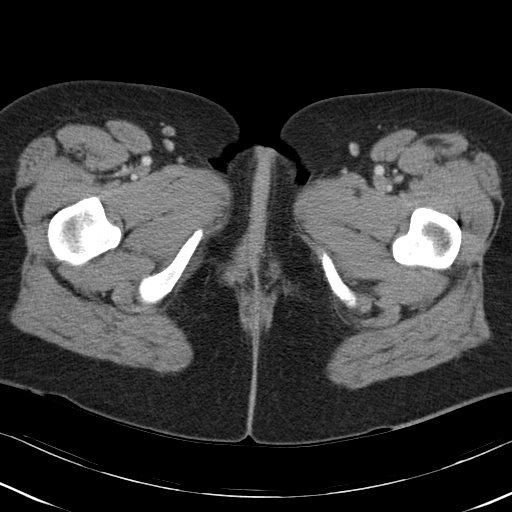
[im 4/90  bone]
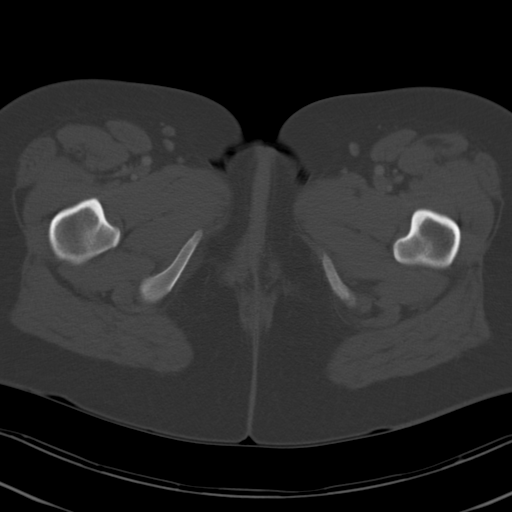
[im 12/90  soft-tissue]
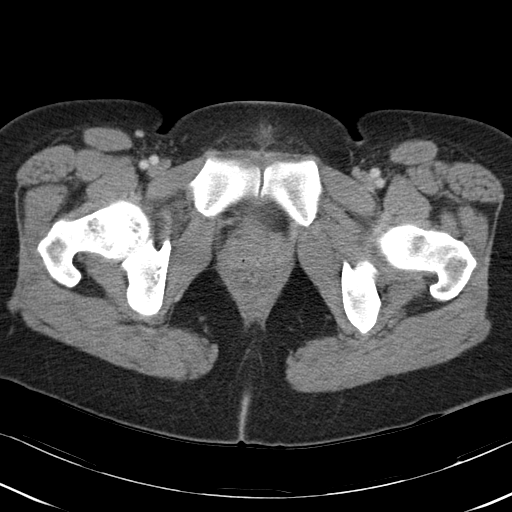
[im 19/90  soft-tissue]
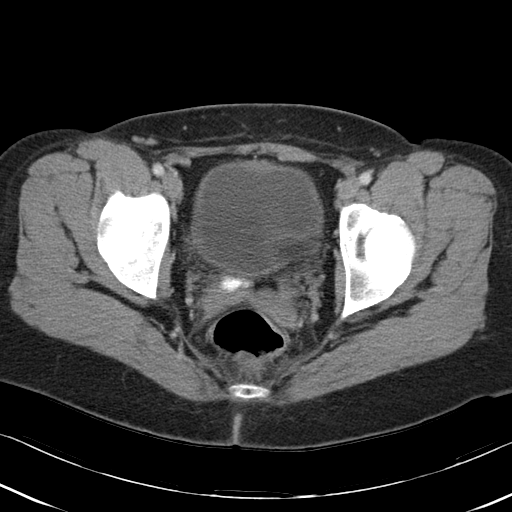
[im 26/90  soft-tissue]
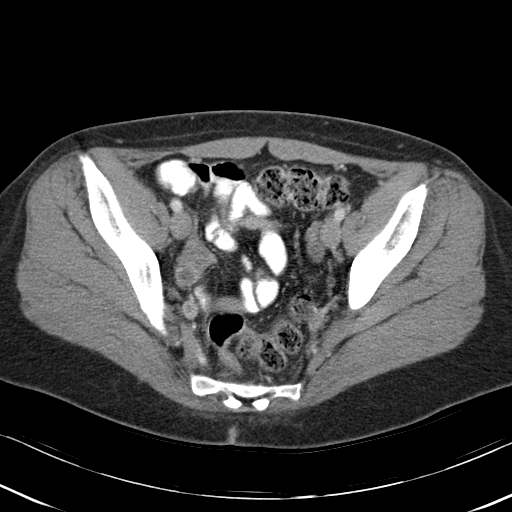
[im 30/90  soft-tissue]
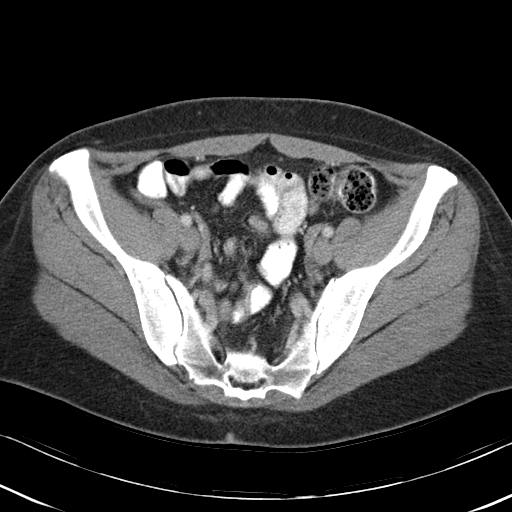
[im 38/90  soft-tissue]
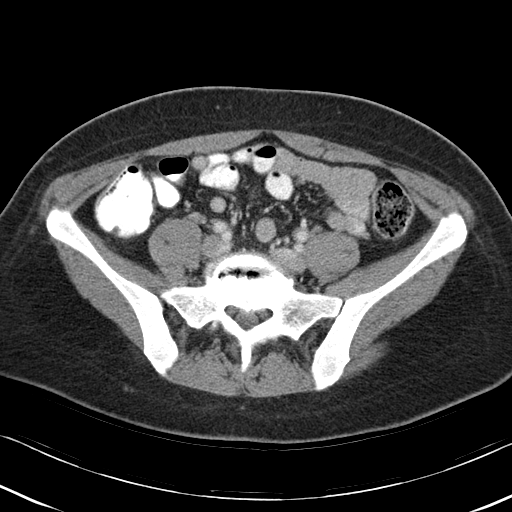
[im 45/90  soft-tissue]
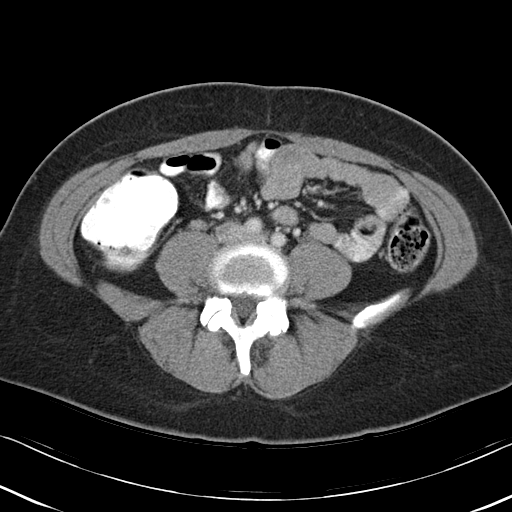
[im 52/90  soft-tissue]
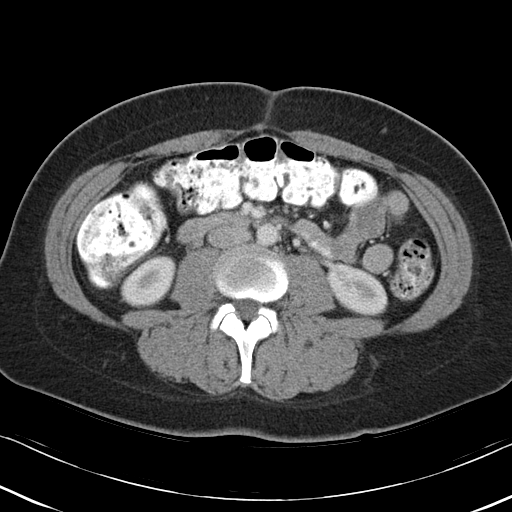
[im 60/90  soft-tissue]
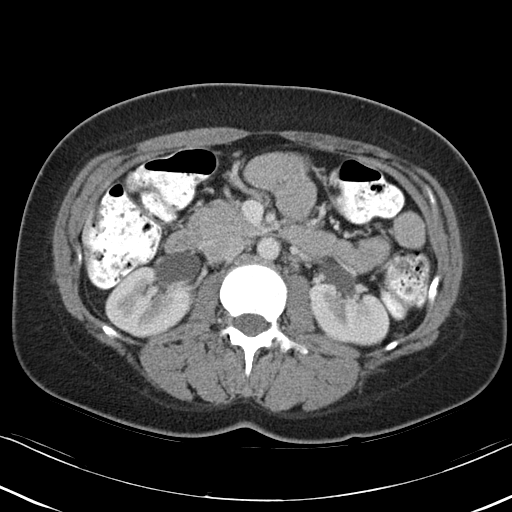
[im 60/90  bone]
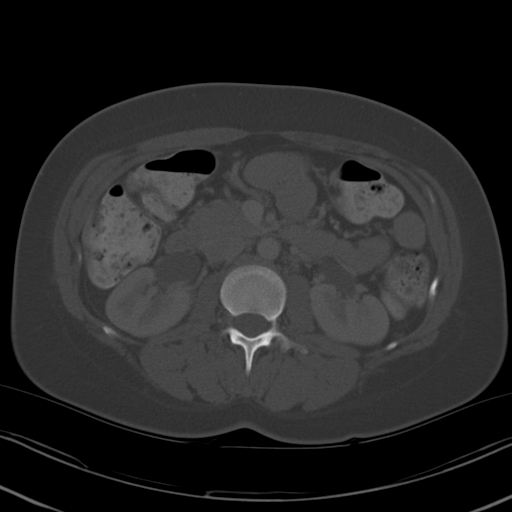
[im 64/90  soft-tissue]
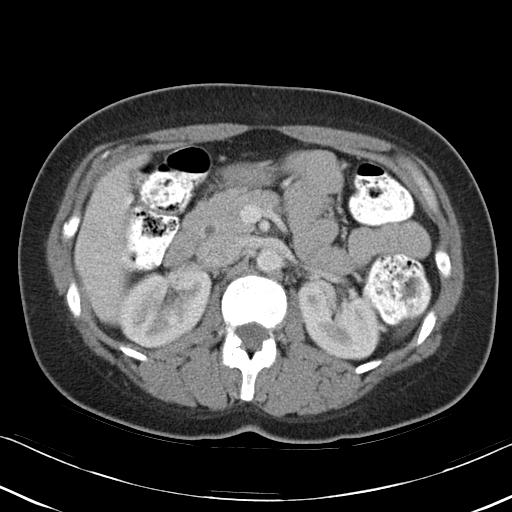
[im 71/90  soft-tissue]
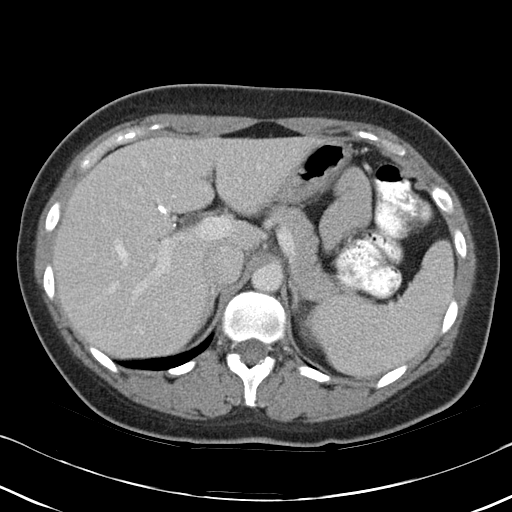
[im 78/90  soft-tissue]
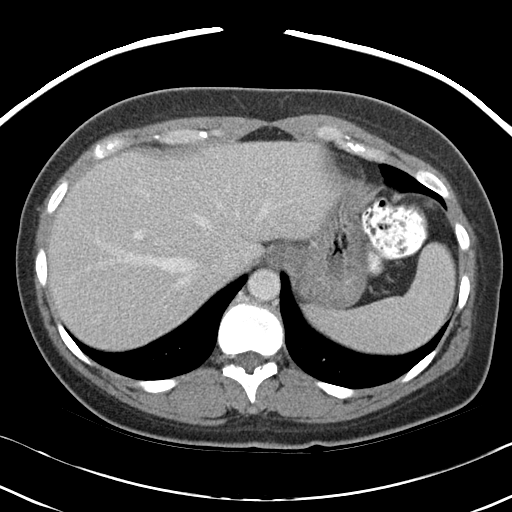
[im 86/90  soft-tissue]
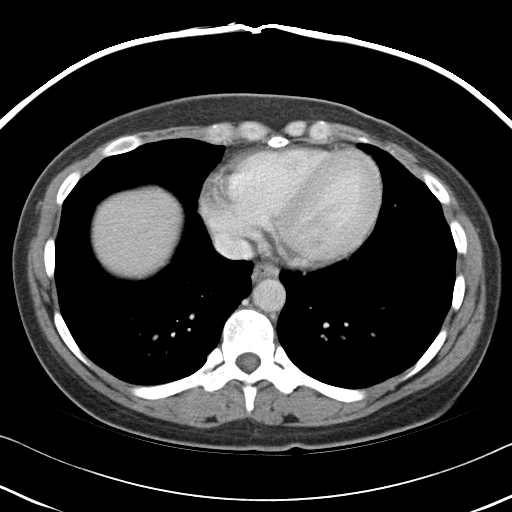

[Series 4: abd_pel_with 3.0 spo cor · coronal · 0.63mm/px · 3 of 72 slices shown]
[im 24/72  soft-tissue]
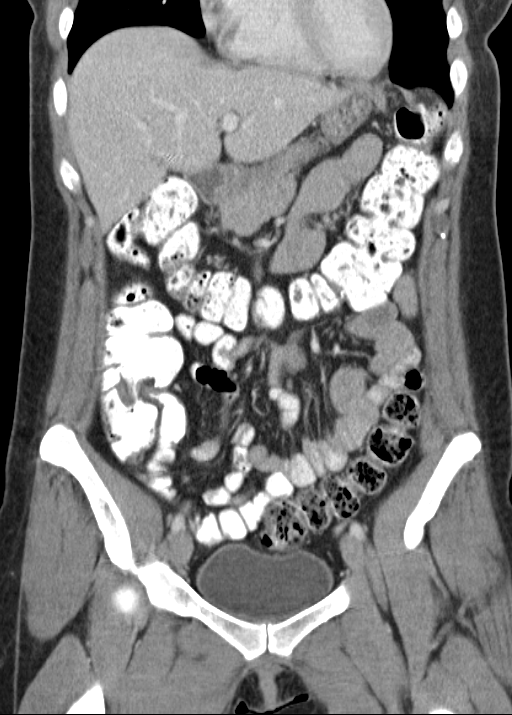
[im 32/72  soft-tissue]
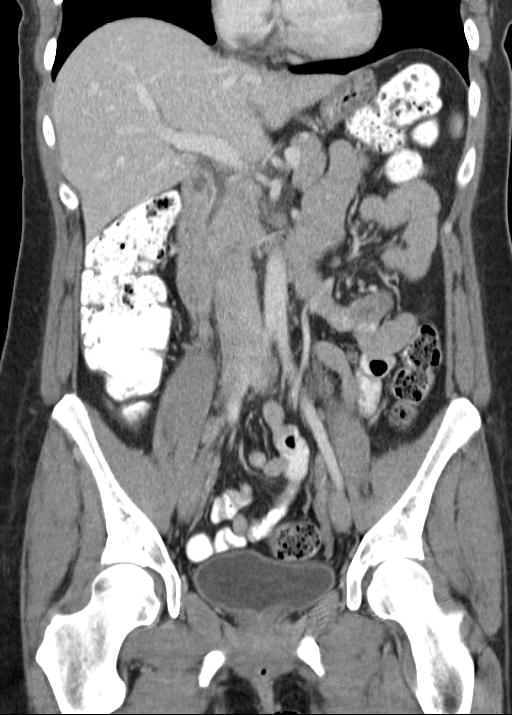
[im 40/72  soft-tissue]
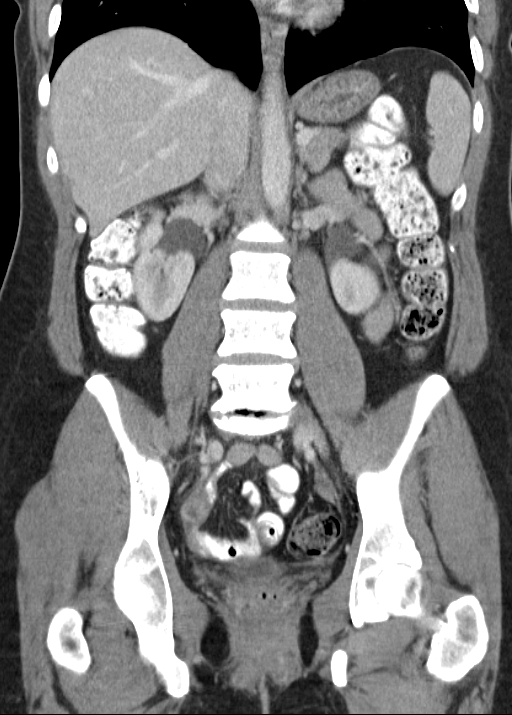

[16 of 46 positions shown; findings below may reference images not displayed]

FINDINGS: Minimal dependent atelectasis at lung bases.
Pancreas normal appearance by CT without peripancreatic edema,
calcifications, mass, or ductal dilatation.
Specifically no features of acute pancreatitis identified.
Peripancreatic vascular structures appear patent.
No biliary dilatation, with post cholecystectomy changes noted.

Liver, spleen, kidneys, and adrenal glands normal appearance.
Tiny umbilical hernia containing fat.
Bladder and ureters unremarkable.
Surgical absence of uterus with normal sized ovaries bilaterally.
Normal appendix.
No mass, adenopathy, free fluid or inflammatory process.
Stomach and bowel loops unremarkable.
No acute osseous findings.
IMPRESSION: Tiny umbilical hernia containing fat.
Otherwise normal exam.
Specifically, no CT evidence of acute pancreatitis identified.

## 2013-04-02 ENCOUNTER — Emergency Department (HOSPITAL_COMMUNITY)
Admission: EM | Admit: 2013-04-02 | Discharge: 2013-04-02 | Disposition: A | Discharge status: Home / Self Care | Payer: PRIVATE HEALTH INSURANCE | Attending: Emergency Medicine | Admitting: Emergency Medicine

## 2013-04-02 ENCOUNTER — Emergency Department (HOSPITAL_COMMUNITY): Payer: PRIVATE HEALTH INSURANCE

## 2013-04-02 ENCOUNTER — Encounter (HOSPITAL_COMMUNITY): Payer: Self-pay | Admitting: *Deleted

## 2013-04-02 DIAGNOSIS — Z79899 Other long term (current) drug therapy: Secondary | ICD-10-CM | POA: Insufficient documentation

## 2013-04-02 DIAGNOSIS — W540XXA Bitten by dog, initial encounter: Secondary | ICD-10-CM | POA: Insufficient documentation

## 2013-04-02 DIAGNOSIS — Y9389 Activity, other specified: Secondary | ICD-10-CM | POA: Insufficient documentation

## 2013-04-02 DIAGNOSIS — S81009A Unspecified open wound, unspecified knee, initial encounter: Secondary | ICD-10-CM | POA: Insufficient documentation

## 2013-04-02 DIAGNOSIS — Y92009 Unspecified place in unspecified non-institutional (private) residence as the place of occurrence of the external cause: Secondary | ICD-10-CM | POA: Insufficient documentation

## 2013-04-02 DIAGNOSIS — T148XXA Other injury of unspecified body region, initial encounter: Secondary | ICD-10-CM

## 2013-04-02 MED ORDER — KETOROLAC TROMETHAMINE 10 MG PO TABS
10.0000 mg | ORAL_TABLET | Freq: Once | ORAL | Status: AC
  Administered 2013-04-02: 10 mg via ORAL
  Filled 2013-04-02: qty 1

## 2013-04-02 MED ORDER — BACITRACIN-NEOMYCIN-POLYMYXIN 400-5-5000 EX OINT
TOPICAL_OINTMENT | Freq: Once | CUTANEOUS | Status: AC
  Administered 2013-04-02: 1 via TOPICAL
  Filled 2013-04-02: qty 1

## 2013-04-02 MED ORDER — ONDANSETRON HCL 4 MG PO TABS
4.0000 mg | ORAL_TABLET | Freq: Once | ORAL | Status: AC
  Administered 2013-04-02: 4 mg via ORAL
  Filled 2013-04-02: qty 1

## 2013-04-02 MED ORDER — AMOXICILLIN-POT CLAVULANATE 875-125 MG PO TABS
1.0000 | ORAL_TABLET | Freq: Once | ORAL | Status: AC
  Administered 2013-04-02: 1 via ORAL
  Filled 2013-04-02: qty 1

## 2013-04-02 MED ORDER — HYDROCODONE-ACETAMINOPHEN 5-325 MG PO TABS: 1.0000 | ORAL_TABLET | ORAL | Status: DC | PRN

## 2013-04-02 MED ORDER — AMOXICILLIN-POT CLAVULANATE 875-125 MG PO TABS: 1.0000 | ORAL_TABLET | Freq: Two times a day (BID) | ORAL | Status: DC

## 2013-04-02 NOTE — ED Notes (Signed)
Animal is up to date on rabies, pt has certificate with her. Animal Control has been contacted.

## 2013-04-02 NOTE — ED Provider Notes (Signed)
CSN: [id number]     Arrival date & time 04/02/13  1925 History   First MD Initiated Contact with Patient 04/02/13 2033     Chief Complaint  Patient presents with  . Animal Bite   (Consider location/radiation/quality/duration/timing/severity/associated sxs/prior Treatment) Patient is a 37 y.o. female presenting with animal bite. The history is provided by the patient.  Animal Bite Contact animal:  Dog Location:  Leg Leg injury location:  L knee Time since incident:  4 hours Pain details:    Severity:  Moderate   Progression:  Unchanged Incident location:  Another residence Notifications:  Animal control Animal's rabies vaccination status:  Up to date Animal in possession: yes   Tetanus status:  Up to date Relieved by:  Cold compresses Worsened by:  Activity Associated symptoms: swelling   Associated symptoms: no numbness     History reviewed. No pertinent past medical history. Past Surgical History  Procedure Laterality Date  . Abdominal hysterectomy    . Tubal ligation    . Back surgery    . Cholecystectomy     History reviewed. No pertinent family history. History  Substance Use Topics  . Smoking status: Never Smoker   . Smokeless tobacco: Not on file  . Alcohol Use: No   OB History   Grav Para Term Preterm Abortions TAB SAB Ect Mult Living                 Review of Systems  Constitutional: Negative for activity change.       All ROS Neg except as noted in HPI  HENT: Negative for nosebleeds and neck pain.   Eyes: Negative for photophobia and discharge.  Respiratory: Negative for cough, shortness of breath and wheezing.   Cardiovascular: Negative for chest pain and palpitations.  Gastrointestinal: Negative for abdominal pain and blood in stool.  Genitourinary: Negative for dysuria, frequency and hematuria.  Musculoskeletal: Negative for back pain and arthralgias.  Skin: Negative.   Neurological: Negative for dizziness, seizures, speech difficulty and  numbness.  Psychiatric/Behavioral: Negative for hallucinations and confusion.    Allergies  Morphine and related  Home Medications   Current Outpatient Rx  Name  Route  Sig  Dispense  Refill  . topiramate (TOPAMAX) 100 MG tablet   Oral   Take 150 mg by mouth daily.          BP 128/81  Pulse 77  Temp(Src) 98.4 F (36.9 C) (Oral)  Resp 20  Ht 5\' 4"  (1.626 m)  Wt 161 lb (73.029 kg)  BMI 27.62 kg/m2  SpO2 100% Physical Exam  Nursing note and vitals reviewed. Constitutional: She is oriented to person, place, and time. She appears well-developed and well-nourished.  Non-toxic appearance.  HENT:  Head: Normocephalic.  Right Ear: Tympanic membrane and external ear normal.  Left Ear: Tympanic membrane and external ear normal.  Eyes: EOM and lids are normal. Pupils are equal, round, and reactive to light.  Neck: Normal range of motion. Neck supple. Carotid bruit is not present.  Cardiovascular: Normal rate, regular rhythm, normal heart sounds, intact distal pulses and normal pulses.   Pulmonary/Chest: Breath sounds normal. No respiratory distress.  Abdominal: Soft. Bowel sounds are normal. There is no tenderness. There is no guarding.  Musculoskeletal: Normal range of motion.  There are 3 puncture wounds noted to the lateral aspect of the left knee. There is an 11 x 11 cm bruise area around 3 puncture wounds. There are also multiple shallow lacerations of the lateral and  mid anterior knee on the left.  There is full range of motion of the left hip. There is pain with range of motion of the left knee. There is mild swelling noted. There is no effusion of the joint. The Achilles tendon is intact. The dorsalis pedis and posterior tibial pulses are 2+.  Lymphadenopathy:       Head (right side): No submandibular adenopathy present.       Head (left side): No submandibular adenopathy present.    She has no cervical adenopathy.  Neurological: She is alert and oriented to person, place,  and time. She has normal strength. No cranial nerve deficit or sensory deficit.  Skin: Skin is warm and dry.  Psychiatric: She has a normal mood and affect. Her speech is normal.    ED Course  Procedures (including critical care time) Labs Review Labs Reviewed - No data to display Imaging Review No results found.  MDM  No diagnosis found. *I have reviewed nursing notes, vital signs, and all appropriate lab and imaging results for this patient.** Animal control notified of the bite incident.Augmentin initiated. Xray of the left knee is negative for air in the joint. Fb, or fx. Rx for augmentin and norco given to the patient. Tetanus up to date. Dog up to date on shots.   Kathie Dike, PA-C 04/08/13 1749

## 2013-04-02 NOTE — ED Notes (Addendum)
Pt was bitten by her boyfriends brothers dog on the outer left side of left knee. Pt has several puncture wounds to this area. Dog has had his rabies vaccination and pt has a rabies vaccination certificate with her. Bite occurred [address] [place] [address] around 2:10 pm. Secretary calling [employer]/Animal control now.

## 2013-04-10 NOTE — ED Provider Notes (Signed)
Medical screening examination/treatment/procedure(s) were performed by non-physician practitioner and as supervising physician I was immediately available for consultation/collaboration.  Flint Melter, MD 04/10/13 [id number]

## 2013-05-25 ENCOUNTER — Other Ambulatory Visit: Payer: Self-pay

## 2018-02-24 ENCOUNTER — Ambulatory Visit (INDEPENDENT_AMBULATORY_CARE_PROVIDER_SITE_OTHER): Payer: Managed Care, Other (non HMO) | Admitting: Internal Medicine

## 2018-02-24 ENCOUNTER — Encounter: Payer: Self-pay | Admitting: Internal Medicine

## 2018-02-24 VITALS — BP 144/84 | HR 98 | Ht 64.0 in | Wt 233.0 lb

## 2018-02-24 DIAGNOSIS — J4530 Mild persistent asthma, uncomplicated: Secondary | ICD-10-CM | POA: Diagnosis not present

## 2018-02-24 MED ORDER — FAMOTIDINE 20 MG PO TABS: ORAL_TABLET | ORAL | 11 refills | Status: AC

## 2018-02-24 MED ORDER — BUDESONIDE-FORMOTEROL FUMARATE 160-4.5 MCG/ACT IN AERO: INHALATION_SPRAY | RESPIRATORY_TRACT | 12 refills | Status: DC

## 2018-02-24 MED ORDER — PANTOPRAZOLE SODIUM 40 MG PO TBEC: 40.0000 mg | DELAYED_RELEASE_TABLET | Freq: Every day | ORAL | 2 refills | Status: AC

## 2018-02-24 MED ORDER — MONTELUKAST SODIUM 10 MG PO TABS: 10.0000 mg | ORAL_TABLET | Freq: Every day | ORAL | 11 refills | Status: AC

## 2018-02-24 MED ORDER — PREDNISONE 10 MG PO TABS: ORAL_TABLET | ORAL | 0 refills | Status: DC

## 2018-02-24 NOTE — Progress Notes (Signed)
Jasmine NissenLisann T Hendricks, female    DOB: [birth year],    MRN: [id number]   Brief patient profile:  [id number] yowf  Quit smoking 2013 with h/o asthma as long back as [place] can remember [place] and seemed worse p stopped smoking at that point needing albuterol once daily in hfa form but then added on neb 2015 and daily since    Brief patient profile:  02/24/2018 1st pulmonary eval/ Shaden Lacher  Chief Complaint  Patient presents with  . Pulmonary Consult    Self referral. Pt c/o SOB, cough and chest discomfort off and on x 6 months. She states that she was dxed with Asthma year ago.  She feels SOB constantly- with or without any exertion. She is currently coughing with clear sputum- relates to PND. Symptoms are esp worse at night. She is using her proair inhaler approx 10 x per day and duoneb about 2 x per day.    Just completed another round of prednisone w/in the last week and helped a lot including reducing her saba use and used saba w/in 4 hours of ov and feeling fine at time of ov but states can't get thru the noc without hs saba and uses it again about 3 am now / assoc overt hb   No obvious patterns in  day to day or daytime variability or assoc excess/ purulent sputum or mucus plugs or hemoptysis or cp or chest tightness, subjective wheeze or overt sinus  symptoms.      Also denies any obvious fluctuation of symptoms with weather or environmental changes or other aggravating or alleviating factors except as outlined above   No unusual exposure hx or h/o childhood pna/  or knowledge of premature birth.  Current Allergies, Complete Past Medical History, Past Surgical History, Family History, and Social History were reviewed in [facility] electronic medical record.  ROS  The following are not active complaints unless bolded Hoarseness, sore throat, dysphagia, dental problems, itching, sneezing,  nasal congestion or discharge of excess mucus or purulent secretions, ear  ache,   fever, chills, sweats, unintended wt loss or wt gain, classically pleuritic or exertional cp,  orthopnea pnd or arm/hand swelling  or leg swelling, presyncope, palpitations, abdominal pain, anorexia, nausea, vomiting, diarrhea  or change in bowel habits or change in bladder habits, change in stools or change in urine, dysuria, hematuria,  rash, arthralgias, visual complaints, headache, numbness, weakness or ataxia or problems with walking or coordination,  change in mood= anxious or  memory.           No past medical history on file.  Outpatient Medications Prior to Visit  Medication Sig Dispense Refill  . Ascorbic Acid (VITAMIN C PO) Take 1 capsule by mouth daily.    . Cholecalciferol (VITAMIN D PO) Take 1 capsule by mouth daily.    . DULoxetine (CYMBALTA) 60 MG capsule Take 60 mg by mouth daily.    Marland Kitchen. ELDERBERRY PO Take 1 capsule by mouth daily.    . hydrochlorothiazide (MICROZIDE) 12.5 MG capsule Take 12.5 mg by mouth daily.    Marland Kitchen. ipratropium-albuterol (DUONEB) 0.5-2.5 (3) MG/3ML SOLN Take 3 mLs by nebulization every 4 (four) hours as needed.    Marland Kitchen. MAGNESIUM PO Take 1 capsule by mouth daily.    Marland Kitchen. PROAIR HFA 108 (90 Base) MCG/ACT inhaler Inhale 2 puffs into the lungs every 4 (four) hours as needed.  0  . TURMERIC PO Take 1 capsule  by mouth daily.    Marland Kitchen amoxicillin-clavulanate (AUGMENTIN) 875-125 MG per tablet Take 1 tablet by mouth every 12 (twelve) hours. 14 tablet 0  . HYDROcodone-acetaminophen (NORCO/VICODIN) 5-325 MG per tablet Take 1 tablet by mouth every 4 (four) hours as needed for pain. 15 tablet 0  . topiramate (TOPAMAX) 100 MG tablet Take 150 mg by mouth daily.     No facility-administered medications prior to visit.               Objective:     BP (!) 144/84 (BP Location: Left Arm, Cuff Size: Normal)   Pulse 98   Ht 5\' 4"  (1.626 m)   Wt 233 lb (105.7 kg)   SpO2 100%   BMI 39.99 kg/m   SpO2: 100 %  RA   Obese wf nad    HEENT: nl dentition, turbinates  bilaterally, and oropharynx. Nl external ear canals without cough reflex   NECK :  without JVD/Nodes/TM/ nl carotid upstrokes bilaterally   LUNGS: no acc muscle use,  Nl contour chest which is clear to A and P bilaterally without cough on insp or exp maneuvers   CV:  RRR  no s3 or murmur or increase in P2, and no edema   ABD:  Obese soft and nontender with nl inspiratory excursion in the supine position. No bruits or organomegaly appreciated, bowel sounds nl  MS:  Nl gait/ ext warm without deformities, calf tenderness, cyanosis or clubbing No obvious joint restrictions   SKIN: warm and dry without lesions    NEURO:  alert, approp, nl sensorium with  no motor or cerebellar deficits apparent.       Assessment   Chronic asthma, mild persistent, uncomplicated 02/24/2018  After extensive coaching inhaler device  effectiveness =    75% try add  symbicort 160 2 bid/ singulair / gerd rx    DDX of  difficult airways management almost all start with A and  include Adherence, Ace Inhibitors, Acid Reflux, Active Sinus Disease, Alpha 1 Antitripsin deficiency, Anxiety masquerading as Airways dz,  ABPA,  Allergy(esp in young), Aspiration (esp in elderly), Adverse effects of meds,  Active smokers, A bunch of PE's (a small clot burden can't cause this syndrome unless there is already severe underlying pulm or vascular dz with poor reserve) plus two Bs  = Bronchiectasis and Beta blocker use..and one C= CHF   Adherence is always the initial "prime suspect" and is a multilayered concern that requires a "trust but verify" approach in every patient - starting with knowing how to use medications, especially inhalers, correctly, keeping up with refills and understanding the fundamental difference between maintenance and prns vs those medications only taken for a very short course and then stopped and not refilled.  - see hfa teaching - return with all meds in hand using a trust but verify approach to confirm  accurate Medication  Reconciliation The principal here is that until we are certain that the  patients are doing what we've asked, it makes no sense to ask them to do more.   ? Acid (or non-acid) GERD > always difficult to exclude as up to 75% of pts in some series report no assoc GI/ Heartburn symptoms> rec max (24h)  acid suppression and diet restrictions/ reviewed and instructions given in writing.   ? Allergy >  Too soon after pred to do profile so rx with pred x 6 days nad f/u in 4 weeks / start singulair and high dose ICS /laba    ?  Anxiety/panic disorder/vcd  > usually at the bottom of this list of usual suspects but should be  higher on this pt's based on H and P and note already on psychotropics and may interfere with adherence and also interpretation of response or lack thereof to symptom management which can be quite subjective.   ?ABPA > check IgE on return    Total time devoted to counseling  > 50 % of initial 60 min office visit:  review case with pt/ discussion of options/alternatives/ personally creating written customized instructions  in presence of pt  then going over those specific  Instructions directly with the pt including how to use all of the meds but in particular covering each new medication in detail and the difference between the maintenance= "automatic" meds and the prns using an action plan format for the latter (If this problem/symptom => do that organization reading Left to right).  Please see AVS from this visit for a full list of these instructions which I personally wrote for this pt and  are unique to this visit.   See device teaching which extended face to face time for this visit         Sandrea Hughs, MD 02/24/2018

## 2018-02-24 NOTE — Patient Instructions (Addendum)
Plan A = Automatic = Symbicort 160 Take 2 puffs first thing in am and then another 2 puffs about 12 hours later and singulair 10 mg daily and Pantoprazole (protonix) 40 mg   Take  30-60 min before first meal of the day and Pepcid (famotidine)  20 mg one @  bedtime until return to office - this is the best way to tell whether stomach acid is contributing to your problem.     Prednisone 10 mg take  4 each am x 2 days,   2 each am x 2 days,  1 each am x 2 days and stop   Work on inhaler technique:  relax and gently blow all the way out then take a nice smooth deep breath back in, triggering the inhaler at same time you start breathing in.  Hold for up to 5 seconds if you can. Blow out thru nose. Rinse and gargle with water when done    Plan B = Backup Only use your albuterol as a rescue medication to be used if you can't catch your breath by resting or doing a relaxed purse lip breathing pattern.  - The less you use it, the better it will work when you need it. - Ok to use the inhaler up to 2 puffs  every 4 hours if you must but call for appointment if use goes up over your usual need - Don't leave home without it !!  (think of it like the spare tire for your car)   Plan C = Crisis - only use your albuterol nebulizer if you first try Plan B and it fails to help > ok to use the nebulizer up to every 4 hours but if start needing it regularly call for immediate appointment     Please schedule a follow up office visit in 4 weeks, sooner if needed  with all medications /inhalers/ solutions in hand so we can verify exactly what you are taking. This includes all medications from all doctors and over the counters - pfts on return

## 2018-02-25 ENCOUNTER — Encounter: Payer: Self-pay | Admitting: Internal Medicine

## 2018-02-25 NOTE — Assessment & Plan Note (Addendum)
02/24/2018  After extensive coaching inhaler device  effectiveness =    75% try add  symbicort 160 2 bid/ singulair / gerd rx    DDX of  difficult airways management almost all start with A and  include Adherence, Ace Inhibitors, Acid Reflux, Active Sinus Disease, Alpha 1 Antitripsin deficiency, Anxiety masquerading as Airways dz,  ABPA,  Allergy(esp in young), Aspiration (esp in elderly), Adverse effects of meds,  Active smokers, A bunch of PE's (a small clot burden can't cause this syndrome unless there is already severe underlying pulm or vascular dz with poor reserve) plus two Bs  = Bronchiectasis and Beta blocker use..and one C= CHF   Adherence is always the initial "prime suspect" and is a multilayered concern that requires a "trust but verify" approach in every patient - starting with knowing how to use medications, especially inhalers, correctly, keeping up with refills and understanding the fundamental difference between maintenance and prns vs those medications only taken for a very short course and then stopped and not refilled.  - see hfa teaching - return with all meds in hand using a trust but verify approach to confirm accurate Medication  Reconciliation The principal here is that until we are certain that the  patients are doing what we've asked, it makes no sense to ask them to do more.   ? Acid (or non-acid) GERD > always difficult to exclude as up to 75% of pts in some series report no assoc GI/ Heartburn symptoms> rec max (24h)  acid suppression and diet restrictions/ reviewed and instructions given in writing.   ? Allergy >  Too soon after pred to do profile so rx with pred x 6 days nad f/u in 4 weeks / start singulair and high dose ICS /laba    ? Anxiety/panic disorder/vcd  > usually at the bottom of this list of usual suspects but should be  higher on this pt's based on H and P and note already on psychotropics and may interfere with adherence and also interpretation of response or  lack thereof to symptom management which can be quite subjective.   ?ABPA > check IgE on return    Total time devoted to counseling  > 50 % of initial 60 min office visit:  review case with pt/ discussion of options/alternatives/ personally creating written customized instructions  in presence of pt  then going over those specific  Instructions directly with the pt including how to use all of the meds but in particular covering each new medication in detail and the difference between the maintenance= "automatic" meds and the prns using an action plan format for the latter (If this problem/symptom => do that organization reading Left to right).  Please see AVS from this visit for a full list of these instructions which I personally wrote for this pt and  are unique to this visit.   See device teaching which extended face to face time for this visit

## 2018-03-28 ENCOUNTER — Encounter: Payer: Self-pay | Admitting: Internal Medicine

## 2018-03-28 ENCOUNTER — Ambulatory Visit (INDEPENDENT_AMBULATORY_CARE_PROVIDER_SITE_OTHER): Payer: Managed Care, Other (non HMO) | Admitting: Internal Medicine

## 2018-03-28 ENCOUNTER — Other Ambulatory Visit (INDEPENDENT_AMBULATORY_CARE_PROVIDER_SITE_OTHER): Payer: Managed Care, Other (non HMO)

## 2018-03-28 VITALS — BP 142/80 | HR 90 | Ht 64.0 in | Wt 227.0 lb

## 2018-03-28 DIAGNOSIS — J4530 Mild persistent asthma, uncomplicated: Secondary | ICD-10-CM | POA: Diagnosis not present

## 2018-03-28 DIAGNOSIS — R05 Cough: Secondary | ICD-10-CM | POA: Diagnosis not present

## 2018-03-28 DIAGNOSIS — R059 Cough, unspecified: Secondary | ICD-10-CM

## 2018-03-28 LAB — PULMONARY FUNCTION TEST
DL/VA % pred: 114 %
DL/VA: 5.48 ml/min/mmHg/L
DLCO UNC % PRED: 113 %
DLCO unc: 27.46 ml/min/mmHg
FEF 25-75 PRE: 3.5 L/s
FEF 25-75 Post: 3.7 L/sec
FEF2575-%CHANGE-POST: 5 %
FEF2575-%Pred-Post: 123 %
FEF2575-%Pred-Pre: 116 %
FEV1-%Change-Post: 2 %
FEV1-%PRED-POST: 105 %
FEV1-%PRED-PRE: 103 %
FEV1-POST: 3.1 L
FEV1-Pre: 3.04 L
FEV1FVC-%CHANGE-POST: 1 %
FEV1FVC-%PRED-PRE: 102 %
FEV6-%Change-Post: 0 %
FEV6-%Pred-Post: 101 %
FEV6-%Pred-Pre: 101 %
FEV6-POST: 3.61 L
FEV6-Pre: 3.61 L
FEV6FVC-%PRED-POST: 102 %
FEV6FVC-%PRED-PRE: 102 %
FVC-%Change-Post: 0 %
FVC-%PRED-PRE: 98 %
FVC-%Pred-Post: 99 %
FVC-PRE: 3.61 L
FVC-Post: 3.61 L
POST FEV1/FVC RATIO: 86 %
PRE FEV1/FVC RATIO: 84 %
PRE FEV6/FVC RATIO: 100 %
Post FEV6/FVC ratio: 100 %
RV % PRED: 107 %
RV: 1.79 L
TLC % pred: 106 %
TLC: 5.36 L

## 2018-03-28 LAB — CBC WITH DIFFERENTIAL/PLATELET
BASOS ABS: 0.1 10*3/uL (ref 0.0–0.1)
Basophils Relative: 1.1 % (ref 0.0–3.0)
EOS ABS: 0.7 10*3/uL (ref 0.0–0.7)
Eosinophils Relative: 8.5 % — ABNORMAL HIGH (ref 0.0–5.0)
HCT: 39.6 % (ref 36.0–46.0)
Hemoglobin: 12.8 g/dL (ref 12.0–15.0)
LYMPHS ABS: 1.8 10*3/uL (ref 0.7–4.0)
Lymphocytes Relative: 21.7 % (ref 12.0–46.0)
MCHC: 32.5 g/dL (ref 30.0–36.0)
MCV: 81 fl (ref 78.0–100.0)
MONO ABS: 0.9 10*3/uL (ref 0.1–1.0)
MONOS PCT: 10.8 % (ref 3.0–12.0)
NEUTROS PCT: 57.9 % (ref 43.0–77.0)
Neutro Abs: 4.9 10*3/uL (ref 1.4–7.7)
Platelets: 402 10*3/uL — ABNORMAL HIGH (ref 150.0–400.0)
RBC: 4.88 Mil/uL (ref 3.87–5.11)
RDW: 15 % (ref 11.5–15.5)
WBC: 8.4 10*3/uL (ref 4.0–10.5)

## 2018-03-28 MED ORDER — BUDESONIDE-FORMOTEROL FUMARATE 80-4.5 MCG/ACT IN AERO: 2.0000 | INHALATION_SPRAY | Freq: Two times a day (BID) | RESPIRATORY_TRACT | 12 refills | Status: AC

## 2018-03-28 NOTE — Progress Notes (Signed)
PFT done today. 

## 2018-03-28 NOTE — Progress Notes (Signed)
Jasmine Hendricks, female    DOB: [birth year],    MRN: [id number]   Brief patient profile:  45 yowf  Quit smoking 2013 with h/o asthma as long back as she can remember growing up in [place] worse in summer moved to [facility] 2001 and seemed worse p stopped smoking at that point needing albuterol once daily in hfa form but then added on neb 2015 and daily since    Brief patient profile:  02/24/2018 1st pulmonary eval/ Jasmine Hendricks  Chief Complaint  Patient presents with  . Pulmonary Consult    Self referral. Pt c/o SOB, cough and chest discomfort off and on x 6 months. She states that she was dxed with Asthma year ago.  She feels SOB constantly- with or without any exertion. She is currently coughing with clear sputum- relates to PND. Symptoms are esp worse at night. She is using her proair inhaler approx 10 x per day and duoneb about 2 x per day.   Just completed another round of prednisone w/in the last week and helped a lot including reducing her saba use and used saba w/in 4 hours of ov and feeling fine at time of ov but states can't get thru the noc without hs saba and uses it again about 3 am now / assoc overt hb  rec Plan A = Automatic = Symbicort 160 Take 2 puffs first thing in am and then another 2 puffs about 12 hours later and singulair 10 mg daily and Pantoprazole (protonix) 40 mg   Take  30-60 min before first meal of the day and Pepcid (famotidine)  20 mg one @  bedtime until return to office - this is the best way to tell whether stomach acid is contributing to your problem.   Prednisone 10 mg take  4 each am x 2 days,   2 each am x 2 days,  1 each am x 2 days and stop  Work on inhaler technique:  relax and gently blow all the way out then take a nice smooth deep breath back in, triggering the inhaler at same time you start breathing in.  Hold for up to 5 seconds if you can. Blow out thru nose. Rinse and gargle with water when done Plan B = Backup Only use your albuterol as a rescue medication to be used  if you can't catch your breath  Plan C = Crisis - only use your albuterol nebulizer if you first try Plan B and it fails to help > ok to use the nebulizer up to every 4 hours but if start needing it regularly call for immediate appointment   03/28/2018  f/u ov/Jasmine Hendricks re:  Asthma/ no copd Chief Complaint  Patient presents with  . Follow-up    PFT's done today. Breathing had improved until approx a wk ago "got sick"- PND and cough.  Cough is currently non prod. She has been using her proair 5 x per wk on average. She has not needed neb.   better x nasal congestion  Lingering with assoc cough p ? Uri worse at hs  Was not needing saba until acutely worse one week prior to OV  / contacts with grandchild and pts at [facility]   No obvious day to day or daytime variability or assoc excess/ purulent sputum or mucus plugs or hemoptysis or cp or chest tightness, subjective wheeze or overt sinus or hb symptoms.    . Also denies any obvious fluctuation of symptoms with weather or  environmental changes or other aggravating or alleviating factors except as outlined above   No unusual exposure hx or h/o childhood pna or knowledge of premature birth.  Current Allergies, Complete Past Medical History, Past Surgical History, Family History, and Social History were reviewed in [employer] record.  ROS  The following are not active complaints unless bolded Hoarseness, sore throat, dysphagia, dental problems, itching, sneezing,  nasal congestion or discharge of excess mucus or purulent secretions, ear ache,   fever, chills, sweats, unintended wt loss or wt gain, classically pleuritic or exertional cp,  orthopnea pnd or arm/hand swelling  or leg swelling, presyncope, palpitations, abdominal pain, anorexia, nausea, vomiting, diarrhea  or change in bowel habits or change in bladder habits, change in stools or change in urine, dysuria, hematuria,  rash, arthralgias, visual complaints, headache, numbness,  weakness or ataxia or problems with walking or coordination,  change in mood or  memory.        Current Meds  Medication Sig  . Ascorbic Acid (VITAMIN C PO) Take 1 capsule by mouth daily.  . Cholecalciferol (VITAMIN D PO) Take 1 capsule by mouth daily.  . DULoxetine (CYMBALTA) 60 MG capsule Take 60 mg by mouth daily.  Marland Kitchen ELDERBERRY PO Take 1 capsule by mouth daily.  . famotidine (PEPCID) 20 MG tablet One at bedtime  . hydrochlorothiazide (MICROZIDE) 12.5 MG capsule Take 12.5 mg by mouth daily.  Marland Kitchen ipratropium-albuterol (DUONEB) 0.5-2.5 (3) MG/3ML SOLN Take 3 mLs by nebulization every 4 (four) hours as needed.  Marland Kitchen MAGNESIUM PO Take 1 capsule by mouth daily.  . montelukast (SINGULAIR) 10 MG tablet Take 1 tablet (10 mg total) by mouth at bedtime.  . pantoprazole (PROTONIX) 40 MG tablet Take 1 tablet (40 mg total) by mouth daily. Take 30-60 min before first meal of the day  . PROAIR HFA 108 (90 Base) MCG/ACT inhaler Inhale 2 puffs into the lungs every 4 (four) hours as needed.  . TURMERIC PO Take 1 capsule by mouth daily.  .   budesonide-formoterol (SYMBICORT) 160-4.5 MCG/ACT inhaler Take 2 puffs first thing in am and then another 2 puffs about 12 hours later.             Objective:    amb  nad  03/28/2018         227   03/28/18 227 lb (103 kg)  02/24/18 233 lb (105.7 kg)  04/02/13 161 lb (73 kg)      Vital signs reviewed - Note on arrival 02 sats  100% on RA     HEENT: nl dentition, and oropharynx. Nl external ear canals without cough reflex- moderate bilateral non-specific turbinate edema  / no purulent secretions    NECK :  without JVD/Nodes/TM/ nl carotid upstrokes bilaterally   LUNGS: no acc muscle use,  Nl contour chest which is clear to A and P bilaterally without cough on insp or exp maneuvers   CV:  RRR  no s3 or murmur or increase in P2, and no edema   ABD:  soft and nontender with nl inspiratory excursion in the supine position. No bruits or organomegaly appreciated,  bowel sounds nl  MS:  Nl gait/ ext warm without deformities, calf tenderness, cyanosis or clubbing No obvious joint restrictions   SKIN: warm and dry without lesions    NEURO:  alert, approp, nl sensorium with  no motor or cerebellar deficits apparent.            Assessment

## 2018-03-28 NOTE — Patient Instructions (Addendum)
Please see patient coordinator before you leave today  to schedule sinus CT    Please remember to go to the lab department downstairs in the basement  for your tests - we will call you with the results when they are available.      Try reduce symbicort to 80 Take 2 puffs first thing in am and then another 2 puffs about 12 hours later.    Please schedule a follow up visit in 3 months but call sooner if needed

## 2018-03-28 NOTE — Assessment & Plan Note (Signed)
02/24/2018   add  symbicort 160 2bid/ singulair / gerd rx   - PFT's  03/28/2018  FEV1 3.10 (105 % ) ratio 86  p 2 % improvement from saba p symb 160 prior to study with DLCO  113 % corrects to 114  % for alv volume   - 03/28/2018  After extensive coaching inhaler device,  effectiveness =    90% changed symb to 80 2bid as pfts nl on 160 during flare of cough  - Allergy profile 03/28/2018 >  Eos 0. /  IgE  ordered - Sinus CT 03/28/2018 pending   Symptoms remain difficult to control yet pfts are very good - DDX of  difficult airways management almost all start with A and  include Adherence, Ace Inhibitors, Acid Reflux, Active Sinus Disease, Alpha 1 Antitripsin deficiency, Anxiety masquerading as Airways dz,  ABPA,  Allergy(esp in young), Aspiration (esp in elderly), Adverse effects of meds,  Active smokers, A bunch of PE's (a small clot burden can't cause this syndrome unless there is already severe underlying pulm or vascular dz with poor reserve) plus two Bs  = Bronchiectasis and Beta blocker use..and one C= CHF   Adherence is always the initial "prime suspect" and is a multilayered concern that requires a "trust but verify" approach in every patient - starting with knowing how to use medications, especially inhalers, correctly, keeping up with refills and understanding the fundamental difference between maintenance and prns vs those medications only taken for a very short course and then stopped and not refilled.  - see hfa teaching   ? Allergy > check profile, continue singulair, try lower dose ics to make sure the higher dose isn't aggravating any upper airway component   ? ABPA > check IgE   ? Acid (or non-acid) GERD > always difficult to exclude as up to 75% of pts in some series report no assoc GI/ Heartburn symptoms> rec continue max (24h)  acid suppression and diet restrictions/ reviewed     ? Active sinus dz > sinus ct ordered but if declined then ent eval next step.   I had an extended discussion  with the patient reviewing all relevant studies completed to date and  lasting 15 to 20 minutes of a 25 minute visit    See device teaching which extended face to face time for this visit.  Each maintenance medication was reviewed in detail including emphasizing most importantly the difference between maintenance and prns and under what circumstances the prns are to be triggered using an action plan format that is not reflected in the computer generated alphabetically organized AVS which I have not found useful in most complex patients, especially with respiratory illnesses  Please see AVS for specific instructions unique to this visit that I personally wrote and verbalized to the the pt in detail and then reviewed with pt  by my nurse highlighting any  changes in therapy recommended at today's visit to their plan of care.

## 2018-03-29 LAB — RESPIRATORY ALLERGY PANEL REGION II W/ RFLX: ~~LOC~~
Allergen, A. alternata, m6: <0.1 kU/L
Allergen, Cedar tree, t12: <0.1 kU/L
Allergen, Comm Silver Birch, t9: <0.1 kU/L
Allergen, Cottonwood, t14: <0.1 kU/L
Allergen, D pternoyssinus,d7: <0.1 kU/L
Allergen, Mouse Urine Protein, e78: <0.1 kU/L
Allergen, Mulberry, t76: <0.1 kU/L
Allergen, Oak,t7: <0.1 kU/L
Allergen, P. notatum, m1: <0.1 kU/L
Aspergillus fumigatus, m3: <0.1 kU/L
Bermuda Grass: <0.1 kU/L
Box Elder IgE: <0.1 kU/L
CLADOSPORIUM HERBARUM (M2) IGE: <0.1 kU/L
COMMON RAGWEED (SHORT) (W1) IGE: 0.38 kU/L — ABNORMAL HIGH
Cat Dander: <0.1 kU/L
Class: 0
Class: 0
Class: 0
Class: 0
Class: 0
Class: 0
Class: 0
Class: 0
Class: 0
Class: 0
Class: 0
Class: 0
Class: 0
Class: 0
Class: 0
Class: 0
Class: 0
Class: 0
Class: 0
Class: 0
Class: 0
Class: 0
Class: 0
Class: 1
Cockroach: <0.1 kU/L
D. farinae: <0.1 kU/L
Dog Dander: <0.1 kU/L
Elm IgE: <0.1 kU/L
IgE (Immunoglobulin E), Serum: 11 kU/L (ref <=114)
Johnson Grass: <0.1 kU/L
Pecan/Hickory Tree IgE: <0.1 kU/L
Rough Pigweed  IgE: <0.1 kU/L
Sheep Sorrel IgE: <0.1 kU/L
Timothy Grass: <0.1 kU/L

## 2018-03-29 LAB — INTERPRETATION:

## 2018-03-29 NOTE — Progress Notes (Signed)
Spoke with pt and notified of results per Dr. Wert. Pt verbalized understanding and denied any questions. 

## 2018-04-06 ENCOUNTER — Inpatient Hospital Stay: Admission: RE | Admit: 2018-04-06 | Payer: Self-pay | Source: Ambulatory Visit

## 2018-04-15 ENCOUNTER — Ambulatory Visit (INDEPENDENT_AMBULATORY_CARE_PROVIDER_SITE_OTHER)
Admission: RE | Admit: 2018-04-15 | Discharge: 2018-04-15 | Disposition: A | Discharge status: Home / Self Care | Payer: Managed Care, Other (non HMO) | Source: Ambulatory Visit | Attending: Internal Medicine | Admitting: Internal Medicine

## 2018-04-15 ENCOUNTER — Other Ambulatory Visit: Payer: Self-pay | Admitting: Internal Medicine

## 2018-04-15 DIAGNOSIS — J4530 Mild persistent asthma, uncomplicated: Secondary | ICD-10-CM | POA: Diagnosis not present

## 2018-04-15 DIAGNOSIS — R05 Cough: Secondary | ICD-10-CM | POA: Diagnosis not present

## 2018-04-15 DIAGNOSIS — R059 Cough, unspecified: Secondary | ICD-10-CM

## 2018-04-15 MED ORDER — AMOXICILLIN-POT CLAVULANATE 875-125 MG PO TABS: 1.0000 | ORAL_TABLET | Freq: Two times a day (BID) | ORAL | 0 refills | Status: AC

## 2018-07-04 ENCOUNTER — Ambulatory Visit: Payer: Self-pay | Admitting: Internal Medicine

## 2018-07-18 ENCOUNTER — Ambulatory Visit: Payer: Managed Care, Other (non HMO) | Admitting: Physical Therapy

## 2019-10-19 ENCOUNTER — Ambulatory Visit: Payer: Managed Care, Other (non HMO)

## 2019-10-19 ENCOUNTER — Ambulatory Visit: Payer: Self-pay | Attending: Internal Medicine

## 2019-10-19 DIAGNOSIS — Z23 Encounter for immunization: Secondary | ICD-10-CM

## 2019-10-19 NOTE — Progress Notes (Signed)
   Covid-19 Vaccination Clinic  Name:  Jasmine Hendricks    MRN: [id number] DOB: July 27, 1975  10/19/2019  Jasmine Hendricks was observed post Covid-19 immunization for 15 minutes without incident. She was provided with Vaccine Information Sheet and instruction to access the V-Safe system.   Jasmine Hendricks was instructed to call 911 with any severe reactions post vaccine: Marland Kitchen Difficulty breathing  . Swelling of face and throat  . A fast heartbeat  . A bad rash all over body  . Dizziness and weakness   Immunizations Administered    Name Date Dose VIS Date Route   Moderna COVID-19 Vaccine 10/19/2019  9:14 AM 0.5 mL 06/20/2019 Intramuscular   Manufacturer: Moderna   Lot: [id number]   NDC: 39767-341-93

## 2019-11-17 ENCOUNTER — Ambulatory Visit: Payer: Self-pay

## 2019-11-21 ENCOUNTER — Ambulatory Visit: Payer: Self-pay

## 2019-11-24 ENCOUNTER — Ambulatory Visit: Payer: Self-pay | Attending: Internal Medicine

## 2019-11-24 DIAGNOSIS — Z23 Encounter for immunization: Secondary | ICD-10-CM

## 2019-11-24 NOTE — Progress Notes (Signed)
   Covid-19 Vaccination Clinic  Name:  Jasmine Hendricks    MRN: [id number] DOB: 08-Aug-1975  11/24/2019  [name] was observed post Covid-19 immunization for 15 minutes without incident. She was provided with Vaccine Information Sheet and instruction to access the V-Safe system.   [name] was instructed to call 911 with any severe reactions post vaccine: Marland Kitchen Difficulty breathing  . Swelling of face and throat  . A fast heartbeat  . A bad rash all over body  . Dizziness and weakness   Immunizations Administered    Name Date Dose VIS Date Route   Moderna COVID-19 Vaccine 11/24/2019  9:54 AM 0.5 mL 06/2019 Intramuscular   Manufacturer: Moderna   Lot: [id number]   NDC: 90475-339-17

## 2019-11-28 ENCOUNTER — Ambulatory Visit: Payer: Self-pay

## 2019-11-29 ENCOUNTER — Ambulatory Visit: Payer: Self-pay

## 2021-06-06 ENCOUNTER — Ambulatory Visit: Payer: Self-pay | Attending: Internal Medicine

## 2021-06-06 ENCOUNTER — Other Ambulatory Visit (HOSPITAL_BASED_OUTPATIENT_CLINIC_OR_DEPARTMENT_OTHER): Payer: Self-pay

## 2021-06-06 DIAGNOSIS — Z23 Encounter for immunization: Secondary | ICD-10-CM

## 2021-06-06 MED ORDER — MODERNA COVID-19 BIVAL BOOSTER 50 MCG/0.5ML IM SUSP
INTRAMUSCULAR | 0 refills | Status: AC
  Filled 2021-06-06: qty 0.5, 1d supply, fill #0

## 2021-06-06 NOTE — Progress Notes (Signed)
   Covid-19 Vaccination Clinic  Name:  ERSEL WADLEIGH    MRN: [id number] DOB: [birth year]  06/06/2021  Ms. Wimberley was observed post Covid-19 immunization for 15 minutes without incident. She was provided with Vaccine Information Sheet and instruction to access the V-Safe system.   Ms. Little was instructed to call 911 with any severe reactions post vaccine: Difficulty breathing  Swelling of face and throat  A fast heartbeat  A bad rash all over body  Dizziness and weakness   Immunizations Administered     Name Date Dose VIS Date Route   Moderna Covid-19 vaccine Bivalent Booster 06/06/2021  2:52 PM 0.5 mL 03/01/2021 Intramuscular   Manufacturer: Gala Murdoch   Lot: [id number]   NDC: 59977-414-23

## 2021-08-15 ENCOUNTER — Other Ambulatory Visit: Payer: Self-pay | Admitting: Neurological Surgery

## 2021-08-15 DIAGNOSIS — G959 Disease of spinal cord, unspecified: Secondary | ICD-10-CM

## 2021-08-15 DIAGNOSIS — M5416 Radiculopathy, lumbar region: Secondary | ICD-10-CM

## 2021-08-25 ENCOUNTER — Ambulatory Visit
Admission: RE | Admit: 2021-08-25 | Discharge: 2021-08-25 | Disposition: A | Discharge status: Home / Self Care | Payer: BC Managed Care – PPO | Source: Ambulatory Visit | Attending: Neurological Surgery | Admitting: Neurological Surgery

## 2021-08-25 ENCOUNTER — Other Ambulatory Visit: Payer: Self-pay

## 2021-08-25 DIAGNOSIS — G959 Disease of spinal cord, unspecified: Secondary | ICD-10-CM

## 2021-08-25 DIAGNOSIS — M5416 Radiculopathy, lumbar region: Secondary | ICD-10-CM

## 2023-12-01 ENCOUNTER — Other Ambulatory Visit: Payer: Self-pay
